# Patient Record
Sex: Female | Born: 1960 | Race: White | Hispanic: No | Smoking: Former smoker
Health system: Southern US, Community
[De-identification: ages and names within clinical notes are randomized; demographics above are authoritative.]

## PROBLEM LIST (undated history)

## (undated) DIAGNOSIS — S42309A Unspecified fracture of shaft of humerus, unspecified arm, initial encounter for closed fracture: Secondary | ICD-10-CM

## (undated) DIAGNOSIS — G629 Polyneuropathy, unspecified: Secondary | ICD-10-CM

## (undated) DIAGNOSIS — S065X9A Traumatic subdural hemorrhage with loss of consciousness of unspecified duration, initial encounter: Secondary | ICD-10-CM

## (undated) DIAGNOSIS — R519 Headache, unspecified: Secondary | ICD-10-CM

## (undated) DIAGNOSIS — F329 Major depressive disorder, single episode, unspecified: Secondary | ICD-10-CM

## (undated) DIAGNOSIS — B1920 Unspecified viral hepatitis C without hepatic coma: Secondary | ICD-10-CM

## (undated) DIAGNOSIS — S065XAA Traumatic subdural hemorrhage with loss of consciousness status unknown, initial encounter: Secondary | ICD-10-CM

## (undated) DIAGNOSIS — E785 Hyperlipidemia, unspecified: Secondary | ICD-10-CM

## (undated) DIAGNOSIS — R569 Unspecified convulsions: Secondary | ICD-10-CM

## (undated) DIAGNOSIS — I2510 Atherosclerotic heart disease of native coronary artery without angina pectoris: Secondary | ICD-10-CM

## (undated) DIAGNOSIS — F1910 Other psychoactive substance abuse, uncomplicated: Secondary | ICD-10-CM

## (undated) DIAGNOSIS — K589 Irritable bowel syndrome without diarrhea: Secondary | ICD-10-CM

## (undated) DIAGNOSIS — F319 Bipolar disorder, unspecified: Secondary | ICD-10-CM

## (undated) DIAGNOSIS — J302 Other seasonal allergic rhinitis: Secondary | ICD-10-CM

## (undated) DIAGNOSIS — F32A Depression, unspecified: Secondary | ICD-10-CM

## (undated) DIAGNOSIS — I10 Essential (primary) hypertension: Secondary | ICD-10-CM

## (undated) DIAGNOSIS — J449 Chronic obstructive pulmonary disease, unspecified: Secondary | ICD-10-CM

## (undated) DIAGNOSIS — F419 Anxiety disorder, unspecified: Secondary | ICD-10-CM

## (undated) DIAGNOSIS — G43909 Migraine, unspecified, not intractable, without status migrainosus: Secondary | ICD-10-CM

## (undated) HISTORY — DX: Unspecified viral hepatitis C without hepatic coma: B19.20

## (undated) HISTORY — DX: Unspecified convulsions: R56.9

## (undated) HISTORY — DX: Essential (primary) hypertension: I10

## (undated) HISTORY — PX: OTHER SURGICAL HISTORY: SHX169

## (undated) HISTORY — DX: Other seasonal allergic rhinitis: J30.2

## (undated) HISTORY — PX: APPENDECTOMY: SHX54

## (undated) HISTORY — DX: Polyneuropathy, unspecified: G62.9

## (undated) HISTORY — DX: Major depressive disorder, single episode, unspecified: F32.9

## (undated) HISTORY — DX: Chronic obstructive pulmonary disease, unspecified: J44.9

## (undated) HISTORY — DX: Migraine, unspecified, not intractable, without status migrainosus: G43.909

## (undated) HISTORY — DX: Headache, unspecified: R51.9

## (undated) HISTORY — PX: ABDOMINAL HYSTERECTOMY: SHX81

## (undated) HISTORY — DX: Traumatic subdural hemorrhage with loss of consciousness status unknown, initial encounter: S06.5XAA

## (undated) HISTORY — DX: Anxiety disorder, unspecified: F41.9

## (undated) HISTORY — DX: Bipolar disorder, unspecified: F31.9

## (undated) HISTORY — DX: Other psychoactive substance abuse, uncomplicated: F19.10

## (undated) HISTORY — DX: Atherosclerotic heart disease of native coronary artery without angina pectoris: I25.10

## (undated) HISTORY — DX: Depression, unspecified: F32.A

## (undated) HISTORY — DX: Hyperlipidemia, unspecified: E78.5

## (undated) HISTORY — DX: Unspecified fracture of shaft of humerus, unspecified arm, initial encounter for closed fracture: S42.309A

## (undated) HISTORY — PX: MULTIPLE TOOTH EXTRACTIONS: SHX2053

## (undated) HISTORY — DX: Traumatic subdural hemorrhage with loss of consciousness of unspecified duration, initial encounter: S06.5X9A

## (undated) HISTORY — DX: Irritable bowel syndrome, unspecified: K58.9

---

## 1998-02-24 ENCOUNTER — Encounter: Admission: RE | Admit: 1998-02-24 | Discharge: 1998-02-24 | Payer: Self-pay | Admitting: Hematology and Oncology

## 1999-02-28 ENCOUNTER — Encounter: Admission: RE | Admit: 1999-02-28 | Discharge: 1999-02-28 | Payer: Self-pay | Admitting: Internal Medicine

## 2000-02-23 ENCOUNTER — Encounter: Admission: RE | Admit: 2000-02-23 | Discharge: 2000-02-23 | Payer: Self-pay | Admitting: Hematology and Oncology

## 2000-09-27 ENCOUNTER — Encounter: Admission: RE | Admit: 2000-09-27 | Discharge: 2000-09-27 | Payer: Self-pay

## 2000-10-09 ENCOUNTER — Ambulatory Visit (HOSPITAL_COMMUNITY): Admission: RE | Admit: 2000-10-09 | Discharge: 2000-10-09 | Payer: Self-pay

## 2000-10-11 ENCOUNTER — Encounter: Payer: Self-pay | Admitting: Obstetrics

## 2000-10-11 ENCOUNTER — Encounter: Admission: RE | Admit: 2000-10-11 | Discharge: 2000-10-11 | Payer: Self-pay | Admitting: Obstetrics

## 2001-01-04 ENCOUNTER — Encounter: Admission: RE | Admit: 2001-01-04 | Discharge: 2001-01-04 | Payer: Self-pay | Admitting: Internal Medicine

## 2001-02-26 ENCOUNTER — Encounter: Admission: RE | Admit: 2001-02-26 | Discharge: 2001-02-26 | Payer: Self-pay | Admitting: Internal Medicine

## 2001-03-29 ENCOUNTER — Encounter: Admission: RE | Admit: 2001-03-29 | Discharge: 2001-03-29 | Payer: Self-pay | Admitting: Internal Medicine

## 2001-04-12 ENCOUNTER — Encounter: Admission: RE | Admit: 2001-04-12 | Discharge: 2001-04-12 | Payer: Self-pay | Admitting: Internal Medicine

## 2001-06-17 ENCOUNTER — Encounter: Admission: RE | Admit: 2001-06-17 | Discharge: 2001-06-17 | Payer: Self-pay | Admitting: Internal Medicine

## 2001-09-26 ENCOUNTER — Encounter: Admission: RE | Admit: 2001-09-26 | Discharge: 2001-09-26 | Payer: Self-pay | Admitting: Internal Medicine

## 2001-11-05 ENCOUNTER — Ambulatory Visit (HOSPITAL_COMMUNITY): Admission: RE | Admit: 2001-11-05 | Discharge: 2001-11-05 | Payer: Self-pay | Admitting: Internal Medicine

## 2001-11-05 ENCOUNTER — Encounter: Admission: RE | Admit: 2001-11-05 | Discharge: 2001-11-05 | Payer: Self-pay | Admitting: Internal Medicine

## 2001-11-05 ENCOUNTER — Encounter: Payer: Self-pay | Admitting: Internal Medicine

## 2001-12-21 ENCOUNTER — Emergency Department (HOSPITAL_COMMUNITY): Admission: EM | Admit: 2001-12-21 | Discharge: 2001-12-21 | Payer: Self-pay | Admitting: Emergency Medicine

## 2001-12-27 ENCOUNTER — Encounter: Admission: RE | Admit: 2001-12-27 | Discharge: 2001-12-27 | Payer: Self-pay | Admitting: Internal Medicine

## 2001-12-27 ENCOUNTER — Encounter: Payer: Self-pay | Admitting: Internal Medicine

## 2001-12-27 ENCOUNTER — Ambulatory Visit (HOSPITAL_COMMUNITY): Admission: RE | Admit: 2001-12-27 | Discharge: 2001-12-27 | Payer: Self-pay | Admitting: Internal Medicine

## 2002-07-14 ENCOUNTER — Encounter: Admission: RE | Admit: 2002-07-14 | Discharge: 2002-07-14 | Payer: Self-pay | Admitting: Internal Medicine

## 2002-08-18 ENCOUNTER — Encounter: Admission: RE | Admit: 2002-08-18 | Discharge: 2002-08-18 | Payer: Self-pay | Admitting: Internal Medicine

## 2002-09-24 ENCOUNTER — Ambulatory Visit (HOSPITAL_COMMUNITY): Admission: RE | Admit: 2002-09-24 | Discharge: 2002-09-24 | Payer: Self-pay | Admitting: Internal Medicine

## 2002-09-24 ENCOUNTER — Encounter: Admission: RE | Admit: 2002-09-24 | Discharge: 2002-09-24 | Payer: Self-pay | Admitting: Internal Medicine

## 2002-09-24 ENCOUNTER — Encounter: Payer: Self-pay | Admitting: Internal Medicine

## 2002-12-02 ENCOUNTER — Encounter: Admission: RE | Admit: 2002-12-02 | Discharge: 2002-12-02 | Payer: Self-pay | Admitting: Internal Medicine

## 2002-12-02 ENCOUNTER — Encounter: Payer: Self-pay | Admitting: Internal Medicine

## 2003-07-23 ENCOUNTER — Encounter: Admission: RE | Admit: 2003-07-23 | Discharge: 2003-07-23 | Payer: Self-pay | Admitting: Internal Medicine

## 2003-12-03 ENCOUNTER — Encounter: Admission: RE | Admit: 2003-12-03 | Discharge: 2003-12-03 | Payer: Self-pay | Admitting: Family Medicine

## 2003-12-22 ENCOUNTER — Ambulatory Visit (HOSPITAL_BASED_OUTPATIENT_CLINIC_OR_DEPARTMENT_OTHER): Admission: RE | Admit: 2003-12-22 | Discharge: 2003-12-22 | Payer: Self-pay | Admitting: Orthopedic Surgery

## 2003-12-22 ENCOUNTER — Ambulatory Visit (HOSPITAL_COMMUNITY): Admission: RE | Admit: 2003-12-22 | Discharge: 2003-12-22 | Payer: Self-pay | Admitting: Orthopedic Surgery

## 2003-12-22 ENCOUNTER — Encounter (INDEPENDENT_AMBULATORY_CARE_PROVIDER_SITE_OTHER): Payer: Self-pay | Admitting: Specialist

## 2004-12-27 ENCOUNTER — Encounter: Admission: RE | Admit: 2004-12-27 | Discharge: 2004-12-27 | Payer: Self-pay | Admitting: Family Medicine

## 2005-01-26 ENCOUNTER — Other Ambulatory Visit: Admission: RE | Admit: 2005-01-26 | Discharge: 2005-01-26 | Payer: Self-pay | Admitting: Obstetrics and Gynecology

## 2005-02-23 ENCOUNTER — Ambulatory Visit (HOSPITAL_COMMUNITY): Admission: RE | Admit: 2005-02-23 | Discharge: 2005-02-23 | Payer: Self-pay | Admitting: Gastroenterology

## 2005-02-23 ENCOUNTER — Encounter (INDEPENDENT_AMBULATORY_CARE_PROVIDER_SITE_OTHER): Payer: Self-pay | Admitting: *Deleted

## 2005-09-19 ENCOUNTER — Ambulatory Visit (HOSPITAL_COMMUNITY): Admission: RE | Admit: 2005-09-19 | Discharge: 2005-09-20 | Payer: Self-pay | Admitting: Ophthalmology

## 2006-03-08 ENCOUNTER — Emergency Department (HOSPITAL_COMMUNITY): Admission: EM | Admit: 2006-03-08 | Discharge: 2006-03-08 | Payer: Self-pay | Admitting: Emergency Medicine

## 2007-11-22 ENCOUNTER — Emergency Department (HOSPITAL_COMMUNITY): Admission: EM | Admit: 2007-11-22 | Discharge: 2007-11-22 | Payer: Self-pay | Admitting: Emergency Medicine

## 2008-03-30 ENCOUNTER — Emergency Department (HOSPITAL_COMMUNITY): Admission: EM | Admit: 2008-03-30 | Discharge: 2008-03-30 | Payer: Self-pay | Admitting: Family Medicine

## 2010-03-24 ENCOUNTER — Ambulatory Visit: Payer: Self-pay | Admitting: Gastroenterology

## 2010-09-03 ENCOUNTER — Encounter: Payer: Self-pay | Admitting: Gastroenterology

## 2010-09-04 ENCOUNTER — Encounter: Payer: Self-pay | Admitting: Family Medicine

## 2010-09-04 ENCOUNTER — Encounter: Payer: Self-pay | Admitting: Gastroenterology

## 2010-10-13 ENCOUNTER — Other Ambulatory Visit: Payer: Self-pay | Admitting: Obstetrics and Gynecology

## 2010-10-13 ENCOUNTER — Encounter (INDEPENDENT_AMBULATORY_CARE_PROVIDER_SITE_OTHER): Payer: Medicaid Other | Admitting: Obstetrics and Gynecology

## 2010-10-13 ENCOUNTER — Other Ambulatory Visit (HOSPITAL_COMMUNITY)
Admission: RE | Admit: 2010-10-13 | Discharge: 2010-10-13 | Disposition: A | Discharge status: Home / Self Care | Payer: Medicaid Other | Source: Ambulatory Visit | Attending: Obstetrics and Gynecology | Admitting: Obstetrics and Gynecology

## 2010-10-13 DIAGNOSIS — R87612 Low grade squamous intraepithelial lesion on cytologic smear of cervix (LGSIL): Secondary | ICD-10-CM

## 2010-10-13 DIAGNOSIS — R87619 Unspecified abnormal cytological findings in specimens from cervix uteri: Secondary | ICD-10-CM | POA: Insufficient documentation

## 2010-11-16 ENCOUNTER — Ambulatory Visit (INDEPENDENT_AMBULATORY_CARE_PROVIDER_SITE_OTHER): Payer: Medicaid Other | Admitting: Obstetrics and Gynecology

## 2010-11-16 DIAGNOSIS — R87612 Low grade squamous intraepithelial lesion on cytologic smear of cervix (LGSIL): Secondary | ICD-10-CM

## 2010-12-30 NOTE — Op Note (Signed)
NAME:  Vanessa Haas, Vanessa Haas             ACCOUNT NO.:  [id number]   MEDICAL RECORD NO.:  [id number]          PATIENT TYPE:  AMB   LOCATION:  SDS                          FACILITY:  [facility]   PHYSICIAN:  John D. Ashley Royalty, M.D. DATE OF BIRTH:  14-Sep-1960   DATE OF PROCEDURE:  09/19/2005  DATE OF DISCHARGE:                                 OPERATIVE REPORT   PREOPERATIVE DIAGNOSIS:  Aphakia, vitreous hemorrhage, retained lens  material.   PROCEDURES:  Pars plana vitrectomy, removal of lens material, placement of  secondary intraocular lens with suture, retinal photocoagulation.   SURGEON:  Alan Mulder, M.D.   ASSISTANT:  Rosalie Doctor, MA   ANESTHESIA:  Is general.   DETAILS:  Usual prep and drape, peritomy from 8 o'clock to 4 o'clock. Half-  thickness scleral flaps raised at 3 o'clock and 9 o'clock in anticipation of  IOL suture. A three layer corneal wound was prepared. 25 gauge trocars were  placed at 8, 10 and 2 o'clock. Pars plana vitrectomy was begun with the 25-  gauge system. Lens material was seen in the pupillary axis and removed. The  vitrectomy is carried posteriorly where all vitreous was removed. The  vitreous hemorrhage was seen at 4 o'clock along with additional lens  fragments. Once all vitreous was removed. The instruments removed from the  eye and plugs were placed. The corneal wound was opened. Two 10-0 Prolene  sutures were passed from 3 o'clock to 9 o'clock behind the iris in the  ciliary sulcus. The sutures were externalized through the corneal wound with  vitreous forceps. The intraocular lens was moved into place where it was  brought onto the field and inspected. It was cleaned. The intraocular lens  was made by [employer] model CZ70BD power 21.5D, length  12.5 MM, optic 7.0 MM, serial number [id number], expiration date 09/2009.  The sutures were attached to the eyelids of the lens. The lens was placed  through the corneal scleral wound and  into the posterior chamber. The lens  was dialed into place and the sutures drawn securely externally. External  sutures were Prolene sutures were then tied beneath the scleral flaps. 10-0  nylon was placed at 3 o'clock scleral flap for closure. The corneal wound  was closed with seven interrupted 10-0 nylon sutures. Trocars were removed  and the wounds were found to be tight. The conjunctiva was reposited with 7-  0 chromic suture. The indirect ophthalmoscope laser was moved into place,  648 burns placed around the retinal periphery with a power of 300  milliwatts, 1000 microns each and 0.07 seconds each. Polymyxin and  gentamicin were irrigated into tenon's space, Decadron 10 milligrams was  injected to the lower subconjunctival space. Marcaine was injected around  the globe for postop pain. Closing tension was 10 with Barraquer tonometer.  Complications none. Duration 1-1/2 hours. TobraDex ophthalmic ointment, a  patch and shield were placed. The patient was awakened and taken to recovery  room in satisfactory condition.     [name]. Ashley Royalty, M.D.  Electronically Signed    JDM/MEDQ  D:  09/19/2005  T:  09/20/2005  Job:  [id number]

## 2010-12-30 NOTE — Op Note (Signed)
NAME:  Vanessa Haas, Vanessa Haas                    ACCOUNT NO.:  [id number]   MEDICAL RECORD NO.:  [id number]                   PATIENT TYPE:  AMB   LOCATION:  DSC                                  FACILITY:  [facility]   PHYSICIAN:  Katy Fitch. Naaman Plummer., M.D.          DATE OF BIRTH:  [birth year]   DATE OF PROCEDURE:  12/22/2003  DATE OF DISCHARGE:                                 OPERATIVE REPORT   PREOPERATIVE DIAGNOSIS:  Soft tissue mass, dorsal radial aspect of right  small finger P2 with background history of arthralgias and elevated ANA with  positive speckled pattern on laboratory testing, rule out an inflammatory  arthropathy.   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   OPERATION PERFORMED:  Excisional biopsy of mass, dorsal radial aspect right  small finger P1 segment.   SURGEON:  Katy Fitch. Sypher, M.D.   ASSISTANT:  Jonni Sanger, P.A.   ANESTHESIA:  2% lidocaine metacarpal head level block of right small finger  supplemented by IV sedation.   SUPERVISING ANESTHESIOLOGIST:  Judie Petit, M.D.   INDICATIONS FOR PROCEDURE:  Vanessa Haas was referred by Vanessa Haas, M.D. of [facility] on October 06, 2003 for  evaluation of multiple bumps on her right hand that she felt was causing  pressure on her digital nerves.  She had a poorly characterized pain  syndrome consistent with arthralgia and discomfort in her fingers.  Clinical  examination on October 09, 2003 revealed several vague soft tissue  swellings on the dorsal radial aspect of her right small finger P1 segment,  the PIP and P2 segment of her right ring finger and the dorsal ulnar aspect  of her right long finger.  Her clinical examination did not reveal an  obvious diagnosis; however, her findings were consistent with some type of  periarticular inflammatory predicament.  I recommended a panel of lab  studies be obtained to rule out a possible  inflammatory arthritis disorder  such as rheumatoid arthritis or lupus or perhaps another autoimmune  connective tissue disease.  Multiple lab studies were obtained on October 07, 2003 including an ANA that was positive with a 1:160 dilution, speckled  pattern, rheumatoid factor which was within normal limits at 5.6, a C-  reactive protein that was elevated at 19.3 with upper limit of normal 4.9  and a sedimentation rate of 7.  Given the findings of an elevated C-reactive  protein and an elevated ANA, I referred Vanessa Haas to Vanessa Haas,  M.D. at [facility].  Vanessa Haas was unable to identify  a discrete arthritis disorder.  Vanessa Haas does have a history of past  chronic hepatitis B.  Due to the presence of her bumps and an unclear  diagnosis as well as persistent discomfort, Vanessa Haas requested biopsy.  We initially contemplated biopsy of the nodules of her long, ring and small  finger; however, with longitudinal observation of  her predicament, we noted  some regression of the nodules in her long finger and her ring finger  although there is still a nodule palpable in the subcutaneous region of her  ring finger P2 segment, dorsoradially.  She had her most defined nodule on  the dorsal radial aspect of her right small finger.  Therefore during  informed consent prior to surgery, I recommended confining our diagnostic  efforts to a biopsy of the small finger nodule in an effort to establish a  tissue diagnosis. In the preoperative holding area, Vanessa Haas was noted  to have a mass measuring approximately 1.5 cm in length and approximately 7  to 8 mm in width that had the feel of a possible granuloma annulare or  perhaps an atypical lipoma.  This did not appear to be a cystic mass such as  a ganglion.  After informed consent she was brought to the operating room at  this time anticipating local anesthesia and sedation.   DESCRIPTION OF PROCEDURE:   Vanessa Haas was brought to the operating  room and placed in supine position on the operating table.  Following  anesthesia consultation by Judie Petit, M.D. and myself, we decided  upon digital block and sedation as the proper anesthetic choice.  Vanessa Haas  does have a history of a seizure disorder and has been advised to avoid  general anesthesia.  After routine Betadine prep, 2% lidocaine was  infiltrated at the metacarpal head level remote from the biopsy site.  Light  sedation was administered.  The right arm was prepped with Betadine soap and  solution and sterilely draped.  A pneumatic tourniquet was applied to the  proximal forearm.  Following exsanguination of the arm with an Esmarch  bandage, an arterial tourniquet on the proximal forearm was inflated to  220  mmHg.  The procedure commenced with a longitudinal incision directly over  the palpable mass.  Subcutaneous tissues were carefully divided revealing a  poorly defined fatty, bland-appearing mass.  This had the consistency of a  possible granuloma annulare or an atypical lipoma.  Care was taken to  dissect the dorsal radial sensory branch to the small finger out of the mass  prior to resection so that she would not have a significant sensory deficit  around the PIP joint.  The mass was circumferentially dissected and passed  off in Formalin for pathologic evaluation.  I also carefully inspected the  palmar aspect of the finger including the radial neurovascular bundle and  did not find any extensions of the mass.   At the conclusion of the procedure, I could not identify what the pathologic  process was based on gross examination.  Hopefully the biopsy specimen will  be of utility in assigning a tissue diagnosis that would be compatible with  her current subjective complaints and objective findings of waxing and waning swelling with abnormal lab studies.  There were no apparent  complications. The wound was  irrigated, hemostasis achieved with bipolar  cautery followed by repair of the skin with intradermal 3-0 Prolene.   For aftercare, she was transferred to the recovery room with stable vital  signs.  She will be discharged with a prescription for Darvocet-N 100 one or  two tablets by mouth every four to six hours as needed for pain, 20 tablets  without refill.  She will return to our office for follow-up in one week for  dressing change, suture removal and advancement to an exercise program.  Katy Fitch Naaman Plummer., M.D.    RVS/MEDQ  D:  12/22/2003  T:  12/23/2003  Job:  [id number]   cc:   Vanessa Haas, M.D.  [address]  [place]  [place] [address]  Fax: [phone]   Vanessa Haas, M.D.  [address]  [place]  [place] [address]  Fax: [phone]

## 2011-07-12 ENCOUNTER — Encounter: Payer: Self-pay | Admitting: Advanced Practice Midwife

## 2011-07-12 ENCOUNTER — Ambulatory Visit: Payer: Medicaid Other | Admitting: Obstetrics and Gynecology

## 2011-07-12 ENCOUNTER — Ambulatory Visit (INDEPENDENT_AMBULATORY_CARE_PROVIDER_SITE_OTHER): Payer: Medicaid Other | Admitting: Advanced Practice Midwife

## 2011-07-12 ENCOUNTER — Other Ambulatory Visit (HOSPITAL_COMMUNITY)
Admission: RE | Admit: 2011-07-12 | Discharge: 2011-07-12 | Disposition: A | Discharge status: Home / Self Care | Payer: Medicaid Other | Source: Ambulatory Visit | Attending: Obstetrics and Gynecology | Admitting: Obstetrics and Gynecology

## 2011-07-12 VITALS — BP 137/89 | HR 96 | Temp 97.2°F | Resp 20 | Ht 63.0 in | Wt 145.5 lb

## 2011-07-12 DIAGNOSIS — Z124 Encounter for screening for malignant neoplasm of cervix: Secondary | ICD-10-CM

## 2011-07-12 DIAGNOSIS — Z01419 Encounter for gynecological examination (general) (routine) without abnormal findings: Secondary | ICD-10-CM | POA: Insufficient documentation

## 2011-07-12 NOTE — Patient Instructions (Signed)
Abnormal Pap Test WHAT DOES A PAP TEST CHECK FOR? A Pap test checks the cells in the cervix for any infections or changes that may turn into cancer. The cells are checked to see if they look normal or if they show any changes (abnormal). Abnormal changes may be a sign of problems with your cervix. Cervical cells that are abnormal are called cervical dysplasia. Dysplasia is not cancer. It is a pre-cancerous change in the cells of your cervix. WHAT DOES AN ABNORMAL PAP TEST MEAN? Most times, an abnormal Pap test does not mean you have cancer. However, it does show that there may be a problem. Your doctor will want to do other tests to find out more about the abnormal cells.  Your abnormal Pap test results could show:  Small changes that should be carefully watched.   Dysplasia that could grow into cancer.   Cancer.  When dysplasia is found and treated early, it most often does not grow into cancer. WHAT WILL BE DONE ABOUT MY ABNORMAL PAP TEST? You may have:  A colposcopy test done. Your cevix will be looked at using a strong light and a microscope.   A cone biopsy. A small, cone-shaped sample of your cervix is taken out. The part that is taken out is the area where the abnormal cells are.   Cryosurgery. The abnormal cells on your cervix will be frozen.   Loop Electrical Excision Procedure (LEEP). The abnormal cells will be taken out.  WHAT IF I HAVE A DYSPLASIA OR A CANCER? You and your doctor may choose a hysterectomy as the best treatment for you. During this surgery, your womb (uterus) and your cervix will be taken out. WHAT SHOULD YOU DO AFTER BEING TREATED? Keep having Pap tests and checkups as often as your doctor tells you. Your cervical problem will be carefully watched so it does not get worse. Also, your doctor can watch for, and treat, any new problems that may come up. Document Released: 11/15/2010 Document Revised: 04/13/2011 Document Reviewed: 11/15/2010 [employer] Patient  Information 2012 [employer]. 

## 2011-07-12 NOTE — Progress Notes (Signed)
Addended by: Toula Moos on: 07/12/2011 04:17 PM   Modules accepted: Orders

## 2011-07-12 NOTE — Progress Notes (Signed)
°  Subjective:     Vanessa Haas is a 50 y.o. woman who comes in today for a  pap smear only. 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 Previous abnormal Pap smears: yes - CIN1. Patient reports no new spotting or abnormal discharge.  The following portions of the patient's history were reviewed and updated as appropriate: allergies, current medications, past family history, past medical history, past social history, past surgical history and problem list.  Review of Systems Pertinent items are noted in HPI.   Objective:    BP 137/89   Pulse 96   Temp(Src) 97.2 F (36.2 C) (Oral)   Resp 20   Ht 5\' 3"  (1.6 m)   Wt 145 lb 8 oz (65.998 kg)   BMI 25.77 kg/m2 Pelvic Exam: cervix normal in appearance, external genitalia normal and vagina normal without discharge. Pap smear obtained.   Assessment:    Screening pap smear.   Plan:   Discussed with patient plan of care. Will await results of screening pap smear. Should results come back abnormal, patient will need repeat colposcopy. With negative results, patient will require annual pap smear screenings for at minimum 10 years. Patient information was distributed. Patient was agreeable to this plan.  Follow up in 6 months, or as indicated by Pap results.   Mosetta Putt, PA-S 07/12/2011

## 2014-06-15 ENCOUNTER — Encounter: Payer: Self-pay | Admitting: Advanced Practice Midwife

## 2018-02-01 ENCOUNTER — Encounter (HOSPITAL_COMMUNITY): Payer: Self-pay | Admitting: *Deleted

## 2018-02-01 ENCOUNTER — Emergency Department (HOSPITAL_COMMUNITY)
Admission: EM | Admit: 2018-02-01 | Discharge: 2018-02-01 | Disposition: A | Discharge status: Home / Self Care | Payer: Medicaid Other | Attending: Emergency Medicine | Admitting: Emergency Medicine

## 2018-02-01 DIAGNOSIS — B1710 Acute hepatitis C without hepatic coma: Secondary | ICD-10-CM | POA: Diagnosis not present

## 2018-02-01 DIAGNOSIS — Z87891 Personal history of nicotine dependence: Secondary | ICD-10-CM | POA: Diagnosis not present

## 2018-02-01 DIAGNOSIS — R197 Diarrhea, unspecified: Secondary | ICD-10-CM | POA: Insufficient documentation

## 2018-02-01 DIAGNOSIS — R1013 Epigastric pain: Secondary | ICD-10-CM | POA: Diagnosis present

## 2018-02-01 DIAGNOSIS — Z79899 Other long term (current) drug therapy: Secondary | ICD-10-CM | POA: Insufficient documentation

## 2018-02-01 DIAGNOSIS — J449 Chronic obstructive pulmonary disease, unspecified: Secondary | ICD-10-CM | POA: Diagnosis not present

## 2018-02-01 DIAGNOSIS — F319 Bipolar disorder, unspecified: Secondary | ICD-10-CM | POA: Insufficient documentation

## 2018-02-01 MED ORDER — GI COCKTAIL ~~LOC~~
30.0000 mL | Freq: Once | ORAL | Status: AC
  Administered 2018-02-01: 30 mL via ORAL
  Filled 2018-02-01: qty 30

## 2018-02-01 MED ORDER — HYDROMORPHONE HCL 1 MG/ML IJ SOLN
0.5000 mg | Freq: Once | INTRAMUSCULAR | Status: AC
  Administered 2018-02-01: 0.5 mg via INTRAMUSCULAR
  Filled 2018-02-01: qty 1

## 2018-02-01 MED ORDER — OXYCODONE-ACETAMINOPHEN 5-325 MG PO TABS: 1.0000 | ORAL_TABLET | Freq: Four times a day (QID) | ORAL | 0 refills | Status: DC | PRN

## 2018-02-01 MED ORDER — PANTOPRAZOLE SODIUM 40 MG PO TBEC
40.0000 mg | DELAYED_RELEASE_TABLET | Freq: Once | ORAL | Status: AC
  Administered 2018-02-01: 40 mg via ORAL
  Filled 2018-02-01: qty 1

## 2018-02-01 MED ORDER — PANTOPRAZOLE SODIUM 20 MG PO TBEC: 20.0000 mg | DELAYED_RELEASE_TABLET | Freq: Two times a day (BID) | ORAL | 0 refills | Status: DC

## 2018-02-01 NOTE — ED Triage Notes (Signed)
Pt complains of right sided abdominal pain, nausea, vomiting, diarrhea. Pt was seen at [facility] and prescribed bentyl, which she states has not helped. Pt has hx of appendectomy and IBS.

## 2018-02-01 NOTE — ED Provider Notes (Signed)
[facility]-EMERGENCY DEPT Provider Note   CSN: [id number] Arrival date & time: 02/01/18  0908     History   Chief Complaint Chief Complaint  Patient presents with  . Abdominal Pain    HPI Vanessa Haas is a 57 y.o. female.  HPI    57 year old female with abdominal pain.  Upper abdomen.  Epigastric to right upper quadrant.  Onset almost a week ago at this point.  She describes burning pain which waxes and wanes but does not completely go away.  Associated with diarrhea.  No fevers or chills.  She reports that she was seen at [facility] 2 days ago.  She had labs and a CT the abdomen pelvis which were unrevealing.  She says she was prescribed Bentyl which she is tried taking without much improvement.  Surgical history significant for appendectomy and cholecystectomy.  She reports that she also sent stool studies there is a GI doctor which came back negative.  She denies any sick contacts.  She has a past history of IBS.  Reports upcoming appointment with her GI doctor on either with this upcoming Tuesday or Wednesday.  [facility] same symptoms. Had CT a/p at this time with report as below. LFTs were WNL at that time.  Results for orders placed or performed during the hospital encounter of 01/30/18  CT ABDOMEN PELVIS W CONTRAST (ROUTINE)  Narrative  CLINICAL DATA: Right upper quadrant abdominal pain. Prior cholecystectomy.  EXAM: CT ABDOMEN AND PELVIS WITH CONTRAST  TECHNIQUE: Multidetector CT imaging of the abdomen and pelvis was performed using the standard protocol following bolus administration of intravenous contrast.  CONTRAST: 100 cc Omnipaque 350 intravenous contrast.  COMPARISON: CT abdomen pelvis dated February 14, 2017.  FINDINGS: Lower chest: No acute abnormality.  Hepatobiliary: Stable hepatic cysts. Status post cholecystectomy. Mild common bile duct and intrahepatic biliary dilatation without obstructing  lesion.  Pancreas: Unremarkable. No pancreatic ductal dilatation or surrounding inflammatory changes.  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 The spleen is normal in size.  Adrenals/Urinary Tract: Adrenal glands are unremarkable. Kidneys are normal, without renal calculi, focal lesion, or hydronephrosis. Bladder is unremarkable.  Stomach/Bowel: Stomach is within normal limits. Appendix is surgically absent. No evidence of bowel wall thickening, distention, or inflammatory changes.  Vascular/Lymphatic: Aortic atherosclerosis. No enlarged abdominal or pelvic lymph nodes.  Reproductive: Status post hysterectomy. No adnexal masses.  Other: No abdominal wall hernia or abnormality. No abdominopelvic ascites. No pneumoperitoneum.  Musculoskeletal: No acute or significant osseous findings. Unchanged moderate degenerative disc disease at L4-L5 with 3 mm retrolisthesis. Stable Schmorl's nodes involving the superior endplates of T12 and L1. Unchanged chronic mild L2 superior endplate compression deformity.  IMPRESSION: 1. Mild common bile duct and intrahepatic biliary dilatation without obstructing lesion, favored related to post cholecystectomy state. However, given the patient's clinical symptoms, correlation with LFTs is recommended. If abnormal, consider further evaluation with ERCP or MRCP. 2. Interval decrease in size of chronic splenic subcapsular hematoma, now measuring 7 mm, previously 1.8 cm. 3. Aortic atherosclerosis (ICD10-I70.0).  Electronically Signed By: Obie DredgeWilliam T Derry M.D. On: 01/30/2018 09:49    Past Medical History:  Diagnosis Date  . Anxiety   . Bipolar 1 disorder, depressed (HCC)   . Cataract   . COPD (chronic obstructive pulmonary disease) (HCC)   . Hepatitis C    Type II  . Seizures (HCC)   .  Substance abuse (HCC)    drug free x4 mos (cocaine)     There are no active problems to display for this patient.   Past Surgical History:  Procedure Laterality Date  . APPENDECTOMY    . MULTIPLE TOOTH EXTRACTIONS     full upper and lower dentures     OB History    Gravida  2   Para      Term      Preterm      AB  2   Living        SAB      TAB  2   Ectopic      Multiple      Live Births               Home Medications    Prior to Admission medications   Medication Sig Start Date End Date Taking? Authorizing Provider  budesonide (ENTOCORT EC) 3 MG 24 hr capsule Take 6 mg by mouth every morning.      [provider]  buPROPion (WELLBUTRIN XL) 150 MG 24 hr tablet Take 150 mg by mouth daily.      [provider]  Diclofenac Potassium (CAMBIA PO) Take by mouth. As needed for migraine H/A     [provider]  Diclofenac Potassium (CAMBIA) 50 MG PACK Take by mouth.      [provider]  dicyclomine (BENTYL) 10 MG capsule Take 10 mg by mouth 4 (four) times daily.      [provider]  hydrOXYzine (ATARAX/VISTARIL) 50 MG tablet Take 50 mg by mouth 2 (two) times daily as needed.      [provider]  paroxetine mesylate (PEXEVA) 40 MG tablet Take 40 mg by mouth every morning. 2 tabs po daily      [provider]  promethazine (PHENERGAN) 25 MG tablet Take 25 mg by mouth every 6 (six) hours as needed.      [provider]  tiZANidine (ZANAFLEX) 4 MG capsule Take 4 mg by mouth 2 (two) times daily.      [provider]  topiramate (TOPAMAX) 100 MG tablet Take 200 mg by mouth daily.      [provider]  traZODone (DESYREL) 100 MG tablet Take 200 mg by mouth at bedtime.      [provider]    Family History No family history on file.  Social History Social History   Tobacco Use  . Smoking status: Former Smoker    Types: Cigarettes  . Smokeless tobacco: Never Used  Substance Use Topics  . Alcohol use: No  . Drug  use: Yes    Types: Cocaine    Comment: recent past- 4 mos ago stopped     Allergies   Codeine   Review of Systems Review of Systems  All systems reviewed and negative, other than as noted in HPI.  Physical Exam Updated Vital Signs BP 103/82 (BP Location: Right Arm)   Pulse 99   Temp 98.7 F (37.1 C) (Oral)   Resp 18   SpO2 100%   Physical Exam  Constitutional: She appears well-developed and well-nourished. No distress.  HENT:  Head: Normocephalic and atraumatic.  Eyes: Conjunctivae are normal. Right eye exhibits no discharge. Left eye exhibits no discharge.  Neck: Neck supple.  Cardiovascular: Normal rate, regular rhythm and normal heart sounds. Exam reveals no gallop and no friction rub.  No murmur heard. Pulmonary/Chest: Effort normal and breath sounds normal. No  respiratory distress.  Abdominal: Soft. She exhibits no distension. There is tenderness.  Epigastric and right upper quadrant tenderness without rebound or guarding.  No distention.  Musculoskeletal: She exhibits no edema or tenderness.  Neurological: She is alert.  Skin: Skin is warm and dry.  Psychiatric: She has a normal mood and affect. Her behavior is normal. Thought content normal.  Nursing note and vitals reviewed.    ED Treatments / Results  Labs (all labs ordered are listed, but only abnormal results are displayed) Labs Reviewed - No data to display  EKG None  Radiology No results found.  Procedures Procedures (including critical care time)  Medications Ordered in ED Medications - No data to display   Initial Impression / Assessment and Plan / ED Course  I have reviewed the triage vital signs and the nursing notes.  Pertinent labs & imaging results that were available during my care of the patient were reviewed by me and considered in my medical decision making (see chart for details).     56yF with abdominal pain. Persistent for several days. Seen at [facility] less than 48 hours ago.  Had labs and CT a/p which were fairly unremarkable. She is s/p cholecystectomy and appendectomy.  I do not think repeating labs nor further imaging are needed on an emergent basis w/o significant change in symptoms since she was last seen.   I reviewed her medications. She is not on a PPI or H2 blocker. Meloxicam noted on med list. When questioned about this she reports she thought it was an allergy medication and has actually been taking it twice a day for over a month. This could simply be NSAID induced gastritis or an ulcer. Advised her to stop taking meloxicam and other NSAIDs. Will start on PPI. She has scheduled GI appointment this upcoming week. Return precautions were discussed.  Final Clinical Impressions(s) / ED Diagnoses   Final diagnoses:  Epigastric pain    ED Discharge Orders    None       Raeford Razor, MD 02/01/18 [phone]

## 2018-02-01 NOTE — ED Notes (Signed)
Bed: [facility] Expected date:  Expected time:  Means of arrival:  Comments: 

## 2018-02-01 NOTE — Discharge Instructions (Addendum)
Stop taking meloxicam (mobic). These are NSAID pain medications like ibuprofen, naproxen, etc. These can be very irritating to your stomach if taken in high doses or for long periods of time. I suspect this may be the reason for your pain. I do not think repeating labs or obtaining more imaging is necessary at this time.   You are being prescribed a medicine for your stomach called protonix. This medicine typically needs to be taken for at least af ew days before people feel much different so don't be discouraged if the pain doesn't go away immediately. Take the prescribed medicine for pain as needed. Follow-up with your GI doctor as previously scheduled and return to the ER for worsening symptoms.

## 2018-02-14 ENCOUNTER — Other Ambulatory Visit: Payer: Self-pay

## 2018-02-14 ENCOUNTER — Emergency Department (HOSPITAL_COMMUNITY)
Admission: EM | Admit: 2018-02-14 | Discharge: 2018-02-14 | Disposition: A | Discharge status: Home / Self Care | Payer: Medicaid Other | Attending: Emergency Medicine | Admitting: Emergency Medicine

## 2018-02-14 ENCOUNTER — Emergency Department (HOSPITAL_COMMUNITY): Payer: Medicaid Other

## 2018-02-14 ENCOUNTER — Encounter (HOSPITAL_COMMUNITY): Payer: Self-pay | Admitting: Emergency Medicine

## 2018-02-14 DIAGNOSIS — J449 Chronic obstructive pulmonary disease, unspecified: Secondary | ICD-10-CM | POA: Insufficient documentation

## 2018-02-14 DIAGNOSIS — R1013 Epigastric pain: Secondary | ICD-10-CM

## 2018-02-14 DIAGNOSIS — R197 Diarrhea, unspecified: Secondary | ICD-10-CM | POA: Insufficient documentation

## 2018-02-14 DIAGNOSIS — Z87891 Personal history of nicotine dependence: Secondary | ICD-10-CM | POA: Insufficient documentation

## 2018-02-14 DIAGNOSIS — R112 Nausea with vomiting, unspecified: Secondary | ICD-10-CM | POA: Diagnosis not present

## 2018-02-14 DIAGNOSIS — Z79899 Other long term (current) drug therapy: Secondary | ICD-10-CM | POA: Diagnosis not present

## 2018-02-14 LAB — CBC WITH DIFFERENTIAL/PLATELET
BASOS ABS: 0.2 10*3/uL — AB (ref 0.0–0.1)
Basophils Relative: 2 %
EOS ABS: 0.5 10*3/uL (ref 0.0–0.7)
EOS PCT: 5 %
HCT: 42.6 % (ref 36.0–46.0)
Hemoglobin: 14.6 g/dL (ref 12.0–15.0)
Lymphocytes Relative: 22 %
Lymphs Abs: 2.1 10*3/uL (ref 0.7–4.0)
MCH: 31.9 pg (ref 26.0–34.0)
MCHC: 34.3 g/dL (ref 30.0–36.0)
MCV: 93 fL (ref 78.0–100.0)
Monocytes Absolute: 0.6 10*3/uL (ref 0.1–1.0)
Monocytes Relative: 6 %
Neutro Abs: 6.2 10*3/uL (ref 1.7–7.7)
Neutrophils Relative %: 65 %
PLATELETS: 204 10*3/uL (ref 150–400)
RBC: 4.58 MIL/uL (ref 3.87–5.11)
RDW: 12.6 % (ref 11.5–15.5)
WBC: 9.6 10*3/uL (ref 4.0–10.5)

## 2018-02-14 LAB — COMPREHENSIVE METABOLIC PANEL
ALT: 23 U/L (ref 0–44)
AST: 67 U/L — AB (ref 15–41)
Albumin: 3.7 g/dL (ref 3.5–5.0)
Alkaline Phosphatase: 67 U/L (ref 38–126)
Anion gap: 9 (ref 5–15)
BUN: 9 mg/dL (ref 6–20)
CHLORIDE: 108 mmol/L (ref 98–111)
CO2: 22 mmol/L (ref 22–32)
CREATININE: 0.98 mg/dL (ref 0.44–1.00)
Calcium: 8.8 mg/dL — ABNORMAL LOW (ref 8.9–10.3)
GFR calc non Af Amer: >60 mL/min (ref >60)
Glucose, Bld: 91 mg/dL (ref 70–99)
Potassium: 4.2 mmol/L (ref 3.5–5.1)
SODIUM: 139 mmol/L (ref 135–145)
Total Bilirubin: 0.6 mg/dL (ref 0.3–1.2)
Total Protein: 6.8 g/dL (ref 6.5–8.1)

## 2018-02-14 LAB — URINALYSIS, ROUTINE W REFLEX MICROSCOPIC
Bilirubin Urine: NEGATIVE
GLUCOSE, UA: NEGATIVE mg/dL
HGB URINE DIPSTICK: NEGATIVE
Ketones, ur: NEGATIVE mg/dL
Leukocytes, UA: NEGATIVE
Nitrite: NEGATIVE
Protein, ur: NEGATIVE mg/dL
SPECIFIC GRAVITY, URINE: 1.002 — AB (ref 1.005–1.030)
pH: 6 (ref 5.0–8.0)

## 2018-02-14 LAB — RAPID URINE DRUG SCREEN, HOSP PERFORMED
AMPHETAMINES: NOT DETECTED
BENZODIAZEPINES: NOT DETECTED
Cocaine: NOT DETECTED
Opiates: NOT DETECTED
TETRAHYDROCANNABINOL: NOT DETECTED

## 2018-02-14 LAB — ETHANOL: ALCOHOL ETHYL (B): 82 mg/dL — AB (ref <10)

## 2018-02-14 LAB — LIPASE, BLOOD: Lipase: 29 U/L (ref 11–51)

## 2018-02-14 MED ORDER — SUCRALFATE 1 G PO TABS
1.0000 g | ORAL_TABLET | Freq: Once | ORAL | Status: AC
Start: 2018-02-14 — End: 2018-02-14
  Administered 2018-02-14: 1 g via ORAL
  Filled 2018-02-14: qty 1

## 2018-02-14 MED ORDER — GI COCKTAIL ~~LOC~~
30.0000 mL | Freq: Once | ORAL | Status: AC
  Administered 2018-02-14: 30 mL via ORAL
  Filled 2018-02-14: qty 30

## 2018-02-14 NOTE — ED Notes (Signed)
Patient walked to nurses station asking for drink. Patients results are not all back notified patient that we will have to ask the doctor first. Patient walked back to room.

## 2018-02-14 NOTE — ED Provider Notes (Signed)
[facility]-EMERGENCY DEPT Provider Note   CSN: [id number] Arrival date & time: 02/14/18  1522     History   Chief Complaint Chief Complaint  Patient presents with  . Abdominal Pain    HPI Vanessa Haas is a 57 y.o. female.  HPI Vanessa NoonLaurie B Haas is a 57 y.o. female with history of bipolar disorder, COPD, hepatitis C, prior appendectomy and cholecystectomy, presents to emergency department complaining of abdominal pain, nausea, vomiting, diarrhea.  Patient states that she has had ongoing abdominal pain for which she has been seen here, at [facility], and by family doctor and gastroenterologist.  She has been seen at least 4 times in the last week for the same.  She is currently taking Bentyl, Protonix, cholestyramine.  She states today she ate lunch and had 2 beers and states that shortly after developed severe epigastric pain.  She has had episode of emesis and diarrhea since then.  She tried taking Bentyl but it did not help.  She received Zofran and 100 mcg of fentanyl by EMS which did not help.  Patient stated "you might of as well given the sugar packet."  Patient has a colonoscopy and endoscopy scheduled in 1 week.  Past Medical History:  Diagnosis Date  . Anxiety   . Bipolar 1 disorder, depressed (HCC)   . Cataract   . COPD (chronic obstructive pulmonary disease) (HCC)   . Hepatitis C    Type II  . Seizures (HCC)   . Substance abuse (HCC)    drug free x4 mos (cocaine)    There are no active problems to display for this patient.   Past Surgical History:  Procedure Laterality Date  . APPENDECTOMY    . MULTIPLE TOOTH EXTRACTIONS     full upper and lower dentures     OB History    Gravida  2   Para      Term      Preterm      AB  2   Living        SAB      TAB  2   Ectopic      Multiple      Live Births               Home Medications    Prior to Admission medications   Medication Sig Start Date End  Date Taking? Authorizing Provider  albuterol (PROVENTIL HFA;VENTOLIN HFA) 108 (90 Base) MCG/ACT inhaler Inhale 2 puffs into the lungs every 6 (six) hours as needed for wheezing or shortness of breath.    [provider]  budesonide-formoterol (SYMBICORT) 80-4.5 MCG/ACT inhaler Inhale 2 puffs into the lungs 2 (two) times daily.    [provider]  buPROPion (WELLBUTRIN) 75 MG tablet Take 75 mg by mouth daily.    [provider]  clonazePAM (KLONOPIN) 1 MG tablet Take 1 mg by mouth 2 (two) times daily.  01/08/18   [provider]  dicyclomine (BENTYL) 20 MG tablet Take 20 mg by mouth 2 (two) times daily. 01/30/18   [provider]  ipratropium-albuterol (DUONEB) 0.5-2.5 (3) MG/3ML SOLN Take 3 mLs by nebulization 4 (four) times daily as needed (shortness of breath).  01/15/18   [provider]  meloxicam (MOBIC) 7.5 MG tablet Take 7.5 mg by mouth daily.    [provider]  montelukast (SINGULAIR) 10 MG tablet Take 10 mg by mouth daily. 01/30/18   [provider]  oxyCODONE-acetaminophen (PERCOCET/ROXICET) 5-325 MG tablet Take 1 tablet by mouth every 6 (six) hours as needed for severe pain. 02/01/18   Raeford Razor, MD  pantoprazole (PROTONIX) 20 MG tablet Take 1 tablet (20 mg total) by mouth 2 (two) times daily before a meal. 02/01/18   Raeford Razor, MD  prazosin (MINIPRESS) 5 MG capsule Take 5 mg by mouth daily.    [provider]  Promethazine-DM 6.25-15 MG/5ML SOLN Take 5 mLs by mouth every 6 (six) hours as needed (cough).  01/18/18   [provider]  SUMAtriptan (IMITREX) 100 MG tablet Take 100 mg by mouth every 2 (two) hours as needed for migraine. May repeat in 2 hours if headache persists or recurs.    [provider]  tiotropium (SPIRIVA) 18 MCG inhalation capsule Place 18 mcg into inhaler and inhale daily as needed (shortness of breath).    [provider]  topiramate (TOPAMAX) 100 MG tablet  Take 200 mg by mouth at bedtime.     [provider]    Family History History reviewed. No pertinent family history.  Social History Social History   Tobacco Use  . Smoking status: Former Smoker    Types: Cigarettes  . Smokeless tobacco: Never Used  Substance Use Topics  . Alcohol use: No  . Drug use: Yes    Types: Cocaine    Comment: recent past- 4 mos ago stopped     Allergies   Codeine; Levofloxacin; and Tape   Review of Systems Review of Systems  Constitutional: Negative for chills and fever.  Respiratory: Negative for cough, chest tightness and shortness of breath.   Cardiovascular: Negative for chest pain, palpitations and leg swelling.  Gastrointestinal: Positive for abdominal pain, diarrhea, nausea and vomiting.  Genitourinary: Negative for dysuria, flank pain, pelvic pain, vaginal bleeding, vaginal discharge and vaginal pain.  Musculoskeletal: Negative for arthralgias, myalgias, neck pain and neck stiffness.  Skin: Negative for rash.  Neurological: Negative for dizziness, weakness and headaches.  All other systems reviewed and are negative.    Physical Exam Updated Vital Signs BP 93/81   Pulse 99   Temp 98.5 F (36.9 C) (Oral)   Resp 18   Ht 5\' 3"  (1.6 m)   Wt 72.6 kg (160 lb)   SpO2 95%   BMI 28.34 kg/m   Physical Exam  Constitutional: She appears well-developed and well-nourished. No distress.  HENT:  Head: Normocephalic.  Eyes: Conjunctivae are normal.  Neck: Neck supple.  Cardiovascular: Normal rate, regular rhythm and normal heart sounds.  Pulmonary/Chest: Effort normal and breath sounds normal. No respiratory distress. She has no wheezes. She has no rales.  Abdominal: Soft. Bowel sounds are normal. She exhibits no distension. There is no rebound.  Abdomen feels firm, slightly distended.  Normal bowel sounds.  Diffuse tenderness to palpation  Musculoskeletal: She exhibits no edema.  Neurological: She is alert.  Skin: Skin is warm  and dry.  Psychiatric: She has a normal mood and affect. Her behavior is normal.  Nursing note and vitals reviewed.    ED Treatments / Results  Labs (all labs ordered are listed, but only abnormal results are displayed) Labs Reviewed  CBC WITH DIFFERENTIAL/PLATELET - Abnormal; Notable for the following components:      Result Value   Basophils Absolute 0.2 (*)    All other components within normal limits  COMPREHENSIVE METABOLIC PANEL - Abnormal; Notable for the following components:   Calcium 8.8 (*)    AST 67 (*)  All other components within normal limits  ETHANOL - Abnormal; Notable for the following components:   Alcohol, Ethyl (B) 82 (*)    All other components within normal limits  URINALYSIS, ROUTINE W REFLEX MICROSCOPIC - Abnormal; Notable for the following components:   Color, Urine STRAW (*)    Specific Gravity, Urine 1.002 (*)    All other components within normal limits  RAPID URINE DRUG SCREEN, HOSP PERFORMED - Abnormal; Notable for the following components:   Barbiturates   (*)    Value: Result not available. Reagent lot number recalled by manufacturer.   All other components within normal limits  LIPASE, BLOOD    EKG None  Radiology Dg Abd Acute W/chest  Result Date: 02/14/2018 CLINICAL DATA:  57 year old female with acute abdominal pain and nausea. EXAM: DG ABDOMEN ACUTE W/ 1V CHEST COMPARISON:  11/19/2017 chest radiograph and prior studies FINDINGS: Nondistended gas and fluid-filled loops of bowel are noted. Small amount of gas in the colon and rectum noted. No evidence of pneumoperitoneum or suspicious calcifications. Cardiomediastinal silhouette is unremarkable. The lungs are clear. No pleural effusion or pneumothorax. No acute bony abnormalities. IMPRESSION: Nonspecific bowel gas pattern without dilated bowel loops or pneumoperitoneum. Question enteritis. Electronically Signed   By: Harmon Pier M.D.   On: 02/14/2018 16:21    Procedures Procedures  (including critical care time)  Medications Ordered in ED Medications  gi cocktail (Maalox,Lidocaine,Donnatal) (has no administration in time range)  sucralfate (CARAFATE) tablet 1 g (has no administration in time range)     Initial Impression / Assessment and Plan / ED Course  I have reviewed the triage vital signs and the nursing notes.  Pertinent labs & imaging results that were available during my care of the patient were reviewed by me and considered in my medical decision making (see chart for details).     Patient with ongoing abdominal pain, diarrhea, and nausea vomiting today.  Had a CT scan several weeks ago which did not show any acute findings.  Since then she has been seen here, [facility], primary care doctor, gastroenterologist.  Symptoms worsened after drinking beer this afternoon and eating lunch.  Pain is strictly epigastric and radiates all over.  Will try GI cocktail, get labs, get acute abdomen.   6:10 PM X-ray with no concern for free air or small bowel obstruction.  Labs unremarkable.  Urinalysis with no signs of infection or hematuria.  Pain improved with GI cocktail and Carafate.  I suspect patient may have gastritis versus peptic ulcer disease.  She is drinking in emergency department with no emesis.  I do not think she needs a CT today.  I will have her follow-up with her GI doctor.  We discussed not drinking alcohol, her level was 80 today.  Discussed no spicy or tomato  based foods.  Bland diet.  Follow-up as needed.  Vitals:   02/14/18 1546 02/14/18 1547 02/14/18 1801 02/14/18 1802  BP: 93/81  (!) 195/180 127/72  Pulse: 99  90   Resp: 18  18   Temp: 98.5 F (36.9 C)     TempSrc: Oral     SpO2: 95%  94%   Weight:  72.6 kg (160 lb)    Height:  5\' 3"  (1.6 m)      Final Clinical Impressions(s) / ED Diagnoses   Final diagnoses:  Nausea vomiting and diarrhea  Epigastric pain    ED Discharge Orders    None  Jaynie Crumble,  PA-C 02/14/18 2102    Tegeler, Canary Brim, MD 02/14/18 2204

## 2018-02-14 NOTE — Discharge Instructions (Addendum)
Take Maalox in addition to her current medications for your abdominal pain.  Please follow-up with gastroenterologist.  Avoid alcohol, avoid any spicy or tomato-based foods.  Bland diet.

## 2018-02-14 NOTE — ED Notes (Signed)
Bed: WA02 Expected date:  Expected time:  Means of arrival:  Comments: [age] F lower epigastric pain x2 months.

## 2018-02-14 NOTE — ED Triage Notes (Signed)
Pt is brought in by EMS from home with c/o upper abdominal pain with nausea, no vomiting or diarrhea---- onset an hour after she ate a [employer] food and drank beer.  Pt also reports that abdominal pain has been going on and off--- she sees a GI specialist for these; has had appendectomy and cholecystectomy.  Pt described pain as "burning".  Pt was given Zofran 4 mg IV and Fentanyl 100 mcg IV.

## 2018-07-09 ENCOUNTER — Institutional Professional Consult (permissible substitution): Payer: Medicaid Other | Admitting: Pulmonary Disease

## 2018-07-16 ENCOUNTER — Institutional Professional Consult (permissible substitution): Payer: Medicaid Other | Admitting: Pulmonary Disease

## 2018-07-16 NOTE — Progress Notes (Deleted)
Synopsis: Referred in *** for ***  Subjective:   PATIENT ID: Vanessa Haas GENDER: female DOB: 07-31-1961, MRN: [id number]   HPI  No chief complaint on file.   ***  Past Medical History:  Diagnosis Date  . Anxiety   . Bipolar 1 disorder, depressed (HCC)   . Cataract   . COPD (chronic obstructive pulmonary disease) (HCC)   . Hepatitis C    Type II  . Seizures (HCC)   . Substance abuse (HCC)    drug free x4 mos (cocaine)     No family history on file.   Social History   Socioeconomic History  . Marital status: Divorced    Spouse name: Not on file  . Number of children: Not on file  . Years of education: Not on file  . Highest education level: Not on file  Occupational History  . Not on file  Social Needs  . Financial resource strain: Not on file  . Food insecurity:    Worry: Not on file    Inability: Not on file  . Transportation needs:    Medical: Not on file    Non-medical: Not on file  Tobacco Use  . Smoking status: Former Smoker    Types: Cigarettes  . Smokeless tobacco: Never Used  Substance and Sexual Activity  . Alcohol use: No  . Drug use: Yes    Types: Cocaine    Comment: recent past- 4 mos ago stopped  . Sexual activity: Yes    Birth control/protection: Post-menopausal  Lifestyle  . Physical activity:    Days per week: Not on file    Minutes per session: Not on file  . Stress: Not on file  Relationships  . Social connections:    Talks on phone: Not on file    Gets together: Not on file    Attends religious service: Not on file    Active member of club or organization: Not on file    Attends meetings of clubs or organizations: Not on file    Relationship status: Not on file  . Intimate partner violence:    Fear of current or ex partner: Not on file    Emotionally abused: Not on file    Physically abused: Not on file    Forced sexual activity: Not on file  Other Topics Concern  . Not on file  Social History Narrative  . Not  on file     Allergies  Allergen Reactions  . Codeine Itching and Rash  . Levofloxacin Nausea And Vomiting    Patient states that it is too strong, will take if she has too. Can hurt kidneys   . Tape Rash    Blisters skin - paper tape is ok     Outpatient Medications Prior to Visit  Medication Sig Dispense Refill  . albuterol (PROVENTIL HFA;VENTOLIN HFA) 108 (90 Base) MCG/ACT inhaler Inhale 2 puffs into the lungs every 6 (six) hours as needed for wheezing or shortness of breath.    . budesonide-formoterol (SYMBICORT) 80-4.5 MCG/ACT inhaler Inhale 2 puffs into the lungs 2 (two) times daily.    Marland Kitchen buPROPion (WELLBUTRIN) 75 MG tablet Take 75 mg by mouth daily.    . clonazePAM (KLONOPIN) 1 MG tablet Take 1 mg by mouth 2 (two) times daily.   1  . dicyclomine (BENTYL) 20 MG tablet Take 20 mg by mouth 2 (two) times daily.  0  . ipratropium-albuterol (DUONEB) 0.5-2.5 (3) MG/3ML SOLN Take 3 mLs by  nebulization 4 (four) times daily as needed (shortness of breath).   1  . meloxicam (MOBIC) 7.5 MG tablet Take 7.5 mg by mouth daily.    . montelukast (SINGULAIR) 10 MG tablet Take 10 mg by mouth daily.  5  . oxyCODONE-acetaminophen (PERCOCET/ROXICET) 5-325 MG tablet Take 1 tablet by mouth every 6 (six) hours as needed for severe pain. 8 tablet 0  . pantoprazole (PROTONIX) 20 MG tablet Take 1 tablet (20 mg total) by mouth 2 (two) times daily before a meal. 60 tablet 0  . prazosin (MINIPRESS) 5 MG capsule Take 5 mg by mouth daily.    . Promethazine-DM 6.25-15 MG/5ML SOLN Take 5 mLs by mouth every 6 (six) hours as needed (cough).   1  . SUMAtriptan (IMITREX) 100 MG tablet Take 100 mg by mouth every 2 (two) hours as needed for migraine. May repeat in 2 hours if headache persists or recurs.    Marland Kitchen. tiotropium (SPIRIVA) 18 MCG inhalation capsule Place 18 mcg into inhaler and inhale daily as needed (shortness of breath).    . topiramate (TOPAMAX) 100 MG tablet Take 200 mg by mouth at bedtime.      No  facility-administered medications prior to visit.     ROS    Objective:  Physical Exam   There were no vitals filed for this visit.  ***  CBC    Component Value Date/Time   WBC 9.6 02/14/2018 1621   RBC 4.58 02/14/2018 1621   HGB 14.6 02/14/2018 1621   HCT 42.6 02/14/2018 1621   PLT 204 02/14/2018 1621   MCV 93.0 02/14/2018 1621   MCH 31.9 02/14/2018 1621   MCHC 34.3 02/14/2018 1621   RDW 12.6 02/14/2018 1621   LYMPHSABS 2.1 02/14/2018 1621   MONOABS 0.6 02/14/2018 1621   EOSABS 0.5 02/14/2018 1621   BASOSABS 0.2 (H) 02/14/2018 1621     Chest imaging:  PFT:  Labs:  Path:  Echo:  Heart Catheterization:       Assessment & Plan:   No diagnosis found.  Discussion: ***    Current Outpatient Medications:  .  albuterol (PROVENTIL HFA;VENTOLIN HFA) 108 (90 Base) MCG/ACT inhaler, Inhale 2 puffs into the lungs every 6 (six) hours as needed for wheezing or shortness of breath., Disp: , Rfl:  .  budesonide-formoterol (SYMBICORT) 80-4.5 MCG/ACT inhaler, Inhale 2 puffs into the lungs 2 (two) times daily., Disp: , Rfl:  .  buPROPion (WELLBUTRIN) 75 MG tablet, Take 75 mg by mouth daily., Disp: , Rfl:  .  clonazePAM (KLONOPIN) 1 MG tablet, Take 1 mg by mouth 2 (two) times daily. , Disp: , Rfl: 1 .  dicyclomine (BENTYL) 20 MG tablet, Take 20 mg by mouth 2 (two) times daily., Disp: , Rfl: 0 .  ipratropium-albuterol (DUONEB) 0.5-2.5 (3) MG/3ML SOLN, Take 3 mLs by nebulization 4 (four) times daily as needed (shortness of breath). , Disp: , Rfl: 1 .  meloxicam (MOBIC) 7.5 MG tablet, Take 7.5 mg by mouth daily., Disp: , Rfl:  .  montelukast (SINGULAIR) 10 MG tablet, Take 10 mg by mouth daily., Disp: , Rfl: 5 .  oxyCODONE-acetaminophen (PERCOCET/ROXICET) 5-325 MG tablet, Take 1 tablet by mouth every 6 (six) hours as needed for severe pain., Disp: 8 tablet, Rfl: 0 .  pantoprazole (PROTONIX) 20 MG tablet, Take 1 tablet (20 mg total) by mouth 2 (two) times daily before a  meal., Disp: 60 tablet, Rfl: 0 .  prazosin (MINIPRESS) 5 MG capsule, Take 5 mg by mouth  daily., Disp: , Rfl:  .  Promethazine-DM 6.25-15 MG/5ML SOLN, Take 5 mLs by mouth every 6 (six) hours as needed (cough). , Disp: , Rfl: 1 .  SUMAtriptan (IMITREX) 100 MG tablet, Take 100 mg by mouth every 2 (two) hours as needed for migraine. May repeat in 2 hours if headache persists or recurs., Disp: , Rfl:  .  tiotropium (SPIRIVA) 18 MCG inhalation capsule, Place 18 mcg into inhaler and inhale daily as needed (shortness of breath)., Disp: , Rfl:  .  topiramate (TOPAMAX) 100 MG tablet, Take 200 mg by mouth at bedtime. , Disp: , Rfl:

## 2018-08-20 ENCOUNTER — Institutional Professional Consult (permissible substitution): Payer: Medicaid Other | Admitting: Pulmonary Disease

## 2018-08-20 NOTE — Progress Notes (Deleted)
Synopsis: Referred in *** for ***  Subjective:   PATIENT ID: Vanessa Haas GENDER: female DOB: 07-31-1961, MRN: [id number]   HPI  No chief complaint on file.   ***  Past Medical History:  Diagnosis Date  . Anxiety   . Bipolar 1 disorder, depressed (HCC)   . Cataract   . COPD (chronic obstructive pulmonary disease) (HCC)   . Hepatitis C    Type II  . Seizures (HCC)   . Substance abuse (HCC)    drug free x4 mos (cocaine)     No family history on file.   Social History   Socioeconomic History  . Marital status: Divorced    Spouse name: Not on file  . Number of children: Not on file  . Years of education: Not on file  . Highest education level: Not on file  Occupational History  . Not on file  Social Needs  . Financial resource strain: Not on file  . Food insecurity:    Worry: Not on file    Inability: Not on file  . Transportation needs:    Medical: Not on file    Non-medical: Not on file  Tobacco Use  . Smoking status: Former Smoker    Types: Cigarettes  . Smokeless tobacco: Never Used  Substance and Sexual Activity  . Alcohol use: No  . Drug use: Yes    Types: Cocaine    Comment: recent past- 4 mos ago stopped  . Sexual activity: Yes    Birth control/protection: Post-menopausal  Lifestyle  . Physical activity:    Days per week: Not on file    Minutes per session: Not on file  . Stress: Not on file  Relationships  . Social connections:    Talks on phone: Not on file    Gets together: Not on file    Attends religious service: Not on file    Active member of club or organization: Not on file    Attends meetings of clubs or organizations: Not on file    Relationship status: Not on file  . Intimate partner violence:    Fear of current or ex partner: Not on file    Emotionally abused: Not on file    Physically abused: Not on file    Forced sexual activity: Not on file  Other Topics Concern  . Not on file  Social History Narrative  . Not  on file     Allergies  Allergen Reactions  . Codeine Itching and Rash  . Levofloxacin Nausea And Vomiting    Patient states that it is too strong, will take if she has too. Can hurt kidneys   . Tape Rash    Blisters skin - paper tape is ok     Outpatient Medications Prior to Visit  Medication Sig Dispense Refill  . albuterol (PROVENTIL HFA;VENTOLIN HFA) 108 (90 Base) MCG/ACT inhaler Inhale 2 puffs into the lungs every 6 (six) hours as needed for wheezing or shortness of breath.    . budesonide-formoterol (SYMBICORT) 80-4.5 MCG/ACT inhaler Inhale 2 puffs into the lungs 2 (two) times daily.    Marland Kitchen buPROPion (WELLBUTRIN) 75 MG tablet Take 75 mg by mouth daily.    . clonazePAM (KLONOPIN) 1 MG tablet Take 1 mg by mouth 2 (two) times daily.   1  . dicyclomine (BENTYL) 20 MG tablet Take 20 mg by mouth 2 (two) times daily.  0  . ipratropium-albuterol (DUONEB) 0.5-2.5 (3) MG/3ML SOLN Take 3 mLs by  nebulization 4 (four) times daily as needed (shortness of breath).   1  . meloxicam (MOBIC) 7.5 MG tablet Take 7.5 mg by mouth daily.    . montelukast (SINGULAIR) 10 MG tablet Take 10 mg by mouth daily.  5  . oxyCODONE-acetaminophen (PERCOCET/ROXICET) 5-325 MG tablet Take 1 tablet by mouth every 6 (six) hours as needed for severe pain. 8 tablet 0  . pantoprazole (PROTONIX) 20 MG tablet Take 1 tablet (20 mg total) by mouth 2 (two) times daily before a meal. 60 tablet 0  . prazosin (MINIPRESS) 5 MG capsule Take 5 mg by mouth daily.    . Promethazine-DM 6.25-15 MG/5ML SOLN Take 5 mLs by mouth every 6 (six) hours as needed (cough).   1  . SUMAtriptan (IMITREX) 100 MG tablet Take 100 mg by mouth every 2 (two) hours as needed for migraine. May repeat in 2 hours if headache persists or recurs.    Marland Kitchen. tiotropium (SPIRIVA) 18 MCG inhalation capsule Place 18 mcg into inhaler and inhale daily as needed (shortness of breath).    . topiramate (TOPAMAX) 100 MG tablet Take 200 mg by mouth at bedtime.      No  facility-administered medications prior to visit.     ROS    Objective:  Physical Exam   There were no vitals filed for this visit.  ***  CBC    Component Value Date/Time   WBC 9.6 02/14/2018 1621   RBC 4.58 02/14/2018 1621   HGB 14.6 02/14/2018 1621   HCT 42.6 02/14/2018 1621   PLT 204 02/14/2018 1621   MCV 93.0 02/14/2018 1621   MCH 31.9 02/14/2018 1621   MCHC 34.3 02/14/2018 1621   RDW 12.6 02/14/2018 1621   LYMPHSABS 2.1 02/14/2018 1621   MONOABS 0.6 02/14/2018 1621   EOSABS 0.5 02/14/2018 1621   BASOSABS 0.2 (H) 02/14/2018 1621     Chest imaging:  PFT:  Labs:  Path:  Echo:  Heart Catheterization:       Assessment & Plan:   No diagnosis found.  Discussion: ***    Current Outpatient Medications:  .  albuterol (PROVENTIL HFA;VENTOLIN HFA) 108 (90 Base) MCG/ACT inhaler, Inhale 2 puffs into the lungs every 6 (six) hours as needed for wheezing or shortness of breath., Disp: , Rfl:  .  budesonide-formoterol (SYMBICORT) 80-4.5 MCG/ACT inhaler, Inhale 2 puffs into the lungs 2 (two) times daily., Disp: , Rfl:  .  buPROPion (WELLBUTRIN) 75 MG tablet, Take 75 mg by mouth daily., Disp: , Rfl:  .  clonazePAM (KLONOPIN) 1 MG tablet, Take 1 mg by mouth 2 (two) times daily. , Disp: , Rfl: 1 .  dicyclomine (BENTYL) 20 MG tablet, Take 20 mg by mouth 2 (two) times daily., Disp: , Rfl: 0 .  ipratropium-albuterol (DUONEB) 0.5-2.5 (3) MG/3ML SOLN, Take 3 mLs by nebulization 4 (four) times daily as needed (shortness of breath). , Disp: , Rfl: 1 .  meloxicam (MOBIC) 7.5 MG tablet, Take 7.5 mg by mouth daily., Disp: , Rfl:  .  montelukast (SINGULAIR) 10 MG tablet, Take 10 mg by mouth daily., Disp: , Rfl: 5 .  oxyCODONE-acetaminophen (PERCOCET/ROXICET) 5-325 MG tablet, Take 1 tablet by mouth every 6 (six) hours as needed for severe pain., Disp: 8 tablet, Rfl: 0 .  pantoprazole (PROTONIX) 20 MG tablet, Take 1 tablet (20 mg total) by mouth 2 (two) times daily before a  meal., Disp: 60 tablet, Rfl: 0 .  prazosin (MINIPRESS) 5 MG capsule, Take 5 mg by mouth  daily., Disp: , Rfl:  .  Promethazine-DM 6.25-15 MG/5ML SOLN, Take 5 mLs by mouth every 6 (six) hours as needed (cough). , Disp: , Rfl: 1 .  SUMAtriptan (IMITREX) 100 MG tablet, Take 100 mg by mouth every 2 (two) hours as needed for migraine. May repeat in 2 hours if headache persists or recurs., Disp: , Rfl:  .  tiotropium (SPIRIVA) 18 MCG inhalation capsule, Place 18 mcg into inhaler and inhale daily as needed (shortness of breath)., Disp: , Rfl:  .  topiramate (TOPAMAX) 100 MG tablet, Take 200 mg by mouth at bedtime. , Disp: , Rfl:

## 2018-09-05 ENCOUNTER — Encounter: Payer: Self-pay | Admitting: *Deleted

## 2018-09-06 ENCOUNTER — Telehealth: Payer: Self-pay | Admitting: *Deleted

## 2018-09-06 ENCOUNTER — Ambulatory Visit: Payer: Medicaid Other | Admitting: Diagnostic Neuroimaging

## 2018-09-06 NOTE — Telephone Encounter (Signed)
Pt no showed for appt today.  

## 2018-09-09 ENCOUNTER — Encounter: Payer: Self-pay | Admitting: Diagnostic Neuroimaging

## 2018-10-24 ENCOUNTER — Encounter: Payer: Self-pay | Admitting: *Deleted

## 2018-10-27 ENCOUNTER — Telehealth: Payer: Self-pay | Admitting: *Deleted

## 2018-10-27 NOTE — Telephone Encounter (Signed)
Left message letting patient know our office will be closed, on 10/28/2018, to disinfect the facility. We will call back to reschedule the appointment that was scheduled for that day. 

## 2018-10-28 ENCOUNTER — Ambulatory Visit: Payer: Medicaid Other | Admitting: Diagnostic Neuroimaging

## 2018-10-28 NOTE — Telephone Encounter (Signed)
Attempted to call patient twice on only number in Epic.  Phone immediately rang busy both times.

## 2018-10-28 NOTE — Telephone Encounter (Signed)
Spoke with patient and advised her of need to reschedule. She stated that as a high risk patient she is not to go anywhere for at least a month. I rescheduled with her husband for afternoon apt at his request, in May. He understands to arrive 30 minutes early to check in, verbalized appreciation for call.

## 2018-12-12 ENCOUNTER — Telehealth: Payer: Self-pay | Admitting: Internal Medicine

## 2018-12-12 NOTE — Telephone Encounter (Signed)
Pt's husband is returning call. Cb is [phone].

## 2018-12-12 NOTE — Telephone Encounter (Signed)
If she is acutely ill then need to go first to ER to screen for covid before we can see her anyway so that's what I recommend if he / they are seeing an acute change for the worse.

## 2018-12-12 NOTE — Telephone Encounter (Signed)
Spoke w/ pt's husband, Jillyn Hidden, regarding MW's recommendations. Pt husband verbalized understanding. He stated pt was in the hospital recently and was already tested negative for COVID-19, however upon looking in her chart I did not see any labs drawn for COVID-19. I made pt's husband aware of this. He stated her saturations were 78% on RA & exertion. Pt is normally on 2L O2. States he increased her O2 up to 3L and her saturations resolved 95% with rest. Denies fever/chills/sweats. States her temp was 98*F at the last reading. Pt husband and pt states they do not think pt is acutely ill. I informed pt husband to maintain pt's O2 as she significantly desaturates without it. Husband expressed understanding.  Pt has a consult appt w/ MW on 11/18/2018 at 1:30 PM. I verified this with both the pt and her husband. I informed them should the pt have increased symptoms such as SOB, decrease in sats <90% with her O2, and/or spike in fever, pt needs to seek emergency care as recommended by MW. Pt and pt husband verbalized understanding.   Routing to MW as an Financial planner. Nothing further needed at this time.

## 2018-12-12 NOTE — Telephone Encounter (Signed)
Attempted to call patient, no answer, left message to call back.  

## 2018-12-12 NOTE — Telephone Encounter (Signed)
Returned call to patient spouse. She was previously scheduled with Dr. Kendrick Fries last year and no-showed. Pt is on 2 liters of 02. Spouse states when her 02 gets up to 95% he takes oxygen off. He checks periodically and if its low he puts oxygen back on. He has checked and found twice that it was 63%. She got up to go to restroom while we were making appt and sat dropped to 83 on 2 Liters.  She sat down and he bumped up to 3 Liters she came back up within secs to 93%.   Pt does not appear to be in any distress. It appears that he is really worked up about her numbers. She has been scheduled for next available consult on MW schedule 12/18/18. MW do you have any suggestions?

## 2018-12-17 ENCOUNTER — Telehealth: Payer: Self-pay | Admitting: *Deleted

## 2018-12-17 NOTE — Telephone Encounter (Signed)
Called patient and advised her that Due to current COVID 19 pandemic, our office is severely reducing in person visits in order to minimize the risk to our patients and healthcare providers. We recommend to convert your appointment to a video visit. She stated she does not have capability, is currently homebound and waiting for call back to tell her where to go to get tested for Covid 19. She asked to be rescheduled and called back with new appt date/time. Rescheduled for 02/11/19, 1:30 pm.  Called her back, and she wrote down new appt., verbalized understanding, appreciation.

## 2018-12-18 ENCOUNTER — Institutional Professional Consult (permissible substitution): Payer: Medicaid Other | Admitting: Internal Medicine

## 2018-12-31 ENCOUNTER — Ambulatory Visit: Payer: Self-pay | Admitting: Diagnostic Neuroimaging

## 2019-01-16 ENCOUNTER — Other Ambulatory Visit: Payer: Self-pay

## 2019-01-16 ENCOUNTER — Ambulatory Visit (INDEPENDENT_AMBULATORY_CARE_PROVIDER_SITE_OTHER): Payer: Medicaid Other | Admitting: Internal Medicine

## 2019-01-16 ENCOUNTER — Encounter: Payer: Self-pay | Admitting: Internal Medicine

## 2019-01-16 VITALS — BP 112/60 | HR 112 | Ht 63.0 in | Wt 163.0 lb

## 2019-01-16 DIAGNOSIS — J449 Chronic obstructive pulmonary disease, unspecified: Secondary | ICD-10-CM | POA: Diagnosis not present

## 2019-01-16 DIAGNOSIS — F17210 Nicotine dependence, cigarettes, uncomplicated: Secondary | ICD-10-CM | POA: Diagnosis not present

## 2019-01-16 MED ORDER — FLUTICASONE-UMECLIDIN-VILANT 100-62.5-25 MCG/INH IN AEPB: 1.0000 | INHALATION_SPRAY | Freq: Every morning | RESPIRATORY_TRACT | 11 refills | Status: DC

## 2019-01-16 MED ORDER — FLUTICASONE-UMECLIDIN-VILANT 100-62.5-25 MCG/INH IN AEPB: 1.0000 | INHALATION_SPRAY | Freq: Every day | RESPIRATORY_TRACT | 0 refills | Status: DC

## 2019-01-16 MED ORDER — ALBUTEROL SULFATE HFA 108 (90 BASE) MCG/ACT IN AERS: INHALATION_SPRAY | RESPIRATORY_TRACT | 11 refills | Status: DC

## 2019-01-16 NOTE — Progress Notes (Signed)
Vanessa Haas, female    DOB: [birth year],    MRN: [id number]   Brief patient profile:  54 yowf active smoker  With onset sob/cough around 2012 and progressively worse  on multiple (TNTC) inhalers/nebs since and noct 02 x 2018 so referred to pulmonary clinic 01/16/2019 by Norva Riffle      History of Present Illness  01/16/2019  Pulmonary/ 1st office eval/Jessee Mezera  Chief Complaint  Patient presents with   Consult    Consult NO:BSJG. States her oxygen drops to 63. She usually wears 2L. He states if he keeps the house temp cooler it helps with her breathing. Smokes 4 cigarettes per day.   Dyspnea:  Mostly rides cart when shops / doe x across the room, better with 02  Cough: "some but it's allergies" worse in ams better p neb/ min mucoid prod Sleep: disrupted at least once - twice noct and needs saba  SABA GEZ:MOQHUTM noted    No obvious day to day or daytime variability or assoc excess/ purulent sputum or mucus plugs or hemoptysis or cp or chest tightness, subjective wheeze or overt sinus or hb symptoms.     Also denies any obvious fluctuation of symptoms with weather or environmental changes or other aggravating or alleviating factors except as outlined above   No unusual exposure hx or h/o childhood pna/ asthma or knowledge of premature birth.  Current Allergies, Complete Past Medical History, Past Surgical History, Family History, and Social History were reviewed in [employer] record.  ROS  The following are not active complaints unless bolded Hoarseness, sore throat, dysphagia, dental problems, itching, sneezing,  nasal congestion or discharge of excess mucus or purulent secretions, ear ache,   fever, chills, sweats, unintended wt loss or wt gain, classically pleuritic or exertional cp,  orthopnea pnd or arm/hand swelling  or leg swelling, presyncope, palpitations, abdominal pain, anorexia, nausea, vomiting, diarrhea  or change in bowel habits or change in  bladder habits, change in stools or change in urine, dysuria, hematuria,  rash, arthralgias, visual complaints, headache, numbness, weakness or ataxia or problems with walking or coordination,  change in mood or  memory.           Past Medical History:  Diagnosis Date   Anxiety    Bipolar 1 disorder, depressed (HCC)    CAD (coronary artery disease)    Cataract    COPD (chronic obstructive pulmonary disease) (HCC)    Depression    Hepatitis C    Type II   Hyperlipidemia    Hypertension    Migraine    Neuropathy    Seizures (HCC)    Substance abuse (HCC)    drug free x4 mos (cocaine)    Outpatient Medications Prior to Visit  Medication Sig Dispense Refill   albuterol (PROVENTIL HFA;VENTOLIN HFA) 108 (90 Base) MCG/ACT inhaler Inhale 2 puffs into the lungs every 6 (six) hours as needed for wheezing or shortness of breath.     budesonide-formoterol (SYMBICORT) 80-4.5 MCG/ACT inhaler Inhale 2 puffs into the lungs 2 (two) times daily.     buPROPion (WELLBUTRIN) 75 MG tablet Take 75 mg by mouth daily.     citalopram (CELEXA) 40 MG tablet Take 40 mg by mouth daily.     clonazePAM (KLONOPIN) 1 MG tablet Take 1 mg by mouth 2 (two) times daily.   1   dicyclomine (BENTYL) 20 MG tablet Take 20 mg by mouth 2 (two) times daily.  0   Fluticasone-Salmeterol (ADVAIR)  100-50 MCG/DOSE AEPB Inhale 1 puff into the lungs 2 (two) times daily.     Fluticasone-Umeclidin-Vilant (TRELEGY ELLIPTA) 100-62.5-25 MCG/INH AEPB Inhale into the lungs.     ipratropium-albuterol (DUONEB) 0.5-2.5 (3) MG/3ML SOLN Take 3 mLs by nebulization 4 (four) times daily as needed (shortness of breath).   1   isosorbide mononitrate (IMDUR) 120 MG 24 hr tablet Take 120 mg by mouth daily.     montelukast (SINGULAIR) 10 MG tablet Take 10 mg by mouth daily.  5   QUEtiapine (SEROQUEL) 50 MG tablet Take 50 mg by mouth at bedtime.     SUMAtriptan (IMITREX) 100 MG tablet Take 100 mg by mouth every 2 (two) hours  as needed for migraine. May repeat in 2 hours if headache persists or recurs.     tiotropium (SPIRIVA) 18 MCG inhalation capsule Place 18 mcg into inhaler and inhale daily as needed (shortness of breath).     topiramate (TOPAMAX) 100 MG tablet Take 200 mg by mouth at bedtime.      ARIPiprazole (ABILIFY) 10 MG tablet Take 10 mg by mouth daily.     carvedilol (COREG) 3.125 MG tablet Take 3.125 mg by mouth 2 (two) times daily with a meal.     meloxicam (MOBIC) 7.5 MG tablet Take 7.5 mg by mouth daily.     oxyCODONE-acetaminophen (PERCOCET/ROXICET) 5-325 MG tablet Take 1 tablet by mouth every 6 (six) hours as needed for severe pain. (Patient not taking: Reported on 01/16/2019) 8 tablet 0   pantoprazole (PROTONIX) 20 MG tablet Take 1 tablet (20 mg total) by mouth 2 (two) times daily before a meal. (Patient not taking: Reported on 01/16/2019) 60 tablet 0   prazosin (MINIPRESS) 5 MG capsule Take 5 mg by mouth daily.     pregabalin (LYRICA) 300 MG capsule Take 300 mg by mouth 2 (two) times daily.     Promethazine-DM 6.25-15 MG/5ML SOLN Take 5 mLs by mouth every 6 (six) hours as needed (cough).   1      Objective:     BP 112/60    Pulse (!) 112    Ht  (1.6 m)    Wt 163 lb (73.9 kg)    SpO2 96%    BMI 28.87 kg/m   SpO2: 96 %  RA  amb hoarse wf nad with prominent pseudowheeze   HEENT: Edentulous / nl oropharynx. Nl external ear canals without cough reflex -  Mild bilateral non-specific turbinate edema     NECK :  without JVD/Nodes/TM/ nl carotid upstrokes bilaterally   LUNGS: no acc muscle use,  Mild barrel  contour chest wall with bilateral  Distant bs s audible wheeze and  without cough on insp or exp maneuver and mild  Hyperresonant  to  percussion bilaterally     CV:  RRR  no s3 or murmur or increase in P2, and no edema   ABD:  soft and nontender with pos mid insp Hoover's  in the supine position. No bruits or organomegaly appreciated, bowel sounds nl  MS:   Nl gait/  ext  warm without deformities, calf tenderness, cyanosis or clubbing No obvious joint restrictions   SKIN: warm and dry without lesions    NEURO:  alert, approp, nl sensorium with  no motor or cerebellar deficits apparent.       I personally reviewed images and agree with radiology impression as follows:   Chest CT w/o contrast 12/03/2018 Bibasilar atelectasis. No other confluent opacities or effusions.  Assessment   COPD GOLD ?  / group D / active smoker Active smoker - 01/16/2019  After extensive coaching inhaler device,  effectiveness =    90% with dpi, 50% with hfa   DDX of  difficult airways management almost all start with A and  include Adherence, Ace Inhibitors, Acid Reflux, Active Sinus Disease, Alpha 1 Antitripsin deficiency, Anxiety masquerading as Airways dz,  ABPA,  Allergy(esp in young), Aspiration (esp in elderly), Adverse effects of meds,  Active smoking or vaping, A bunch of PE's (a small clot burden can't cause this syndrome unless there is already severe underlying pulm or vascular dz with poor reserve) plus two Bs  = Bronchiectasis and Beta blocker use..and one C= CHF   Adherence is always the initial "prime suspect" and is a multilayered concern that requires a "trust but verify" approach in every patient - starting with knowing how to use medications, especially inhalers, correctly, keeping up with refills and understanding the fundamental difference between maintenance and prns vs those medications only taken for a very short course and then stopped and not refilled.  - see device teaching - extremely poor insight into how/when to use meds  - return with all meds in hand using a trust but verify approach to confirm accurate Medication  Reconciliation The principal here is that until we are certain that the  patients are doing what we've asked, it makes no sense to ask them to do more.   Active smoking also at top of list (see separate a/p)   ? Anxiety/depression >  usually at the bottom of this list of usual suspects but should be much higher on this pt's based on H and P and note already on psychotropics and may interfere with adherence and also interpretation of response or lack thereof to symptom management which can be quite subjective.   ?alleryg/ asthma component > continue singulair/ ICS/ add zyrtec prn "allergies"    ? Adverse drug effects > hoarseness probably from too many dpis though could also be due to gerd > try first minimize dpi = one trelegy only, the rest hfa or neb or smi prns   see avs for instructions unique to this ov  ? BB effects > low dose Coreg ok   ? CHF > echo ok 12/06/2018 in care everywhere/ reviewed      >>> f/u in 4 weeks with spirometry if  COVID - 19 restrictions have been lifted.        Cigarette smoker Counseled re importance of smoking cessation but did not meet time criteria for separate billing      Total time devoted to counseling  > 50 % of initial 60 min office visit:  reviewed case with pt/  device teaching which extended face to face time for this visit  discussion of options/alternatives/ personally creating written customized instructions  in presence of pt  then going over those specific  Instructions directly with the pt including how to use all of the meds but in particular covering each new medication in detail and the difference between the maintenance= "automatic" meds and the prns using an action plan format for the latter (If this problem/symptom => do that organization reading Left to right).  Please see AVS from this visit for a full list of these instructions which I personally wrote for this pt and  are unique to this visit.      Sandrea HughsMichael Anastacio Bua, MD 01/16/2019

## 2019-01-16 NOTE — Patient Instructions (Addendum)
Plan A = Automatic = Trelegy take two good drags - hold with right hand and hinge open   Rinse and gargle after use  Plan B = Backup Only use your albuterol inhaler as a rescue medication to be used if you can't catch your breath by resting or doing a relaxed purse lip breathing pattern.  - The less you use it, the better it will work when you need it. - Ok to use the inhaler up to 2 puffs  every 4 hours if you must but call for appointment if use goes up over your usual need - Don't leave home without it !!  (think of it like the spare tire for your car)   Plan C = Crisis - only use your albuterol-ipatropium (prefer pure albuterol)  nebulizer if you first try Plan B and it fails to help > ok to use the nebulizer up to every 4 hours but if start needing it regularly call for immediate appointment   Plan D = Doctor - call me if B and C not adequate  Plan E = ER - go to ER or call 911 if all else fails    For cough > mucinex dm up to1200 mg every 12 hours as needed    For nasal drainage/ throat irritation "allergy"  > zyrtec 10 mg daily as needed    Please schedule a follow up office visit in 4 weeks, call sooner if needed with all medications /inhalers/ solutions in hand so we can verify exactly what you are taking. This includes all medications from all doctors and over the counters - PLEASE separate them into two bags:  the ones you take automatically, no matter what, vs the ones you take just when you feel you need them "BAG #2 is UP TO YOU"  - this will really help [place] help you take your medications more effectively.

## 2019-01-17 ENCOUNTER — Encounter: Payer: Self-pay | Admitting: Internal Medicine

## 2019-01-17 DIAGNOSIS — F17210 Nicotine dependence, cigarettes, uncomplicated: Secondary | ICD-10-CM | POA: Insufficient documentation

## 2019-01-17 DIAGNOSIS — Z87891 Personal history of nicotine dependence: Secondary | ICD-10-CM | POA: Insufficient documentation

## 2019-01-17 NOTE — Assessment & Plan Note (Addendum)
Active smoker - 01/16/2019  After extensive coaching inhaler device,  effectiveness =    90% with dpi, 50% with hfa   DDX of  difficult airways management almost all start with A and  include Adherence, Ace Inhibitors, Acid Reflux, Active Sinus Disease, Alpha 1 Antitripsin deficiency, Anxiety masquerading as Airways dz,  ABPA,  Allergy(esp in young), Aspiration (esp in elderly), Adverse effects of meds,  Active smoking or vaping, A bunch of PE's (a small clot burden can't cause this syndrome unless there is already severe underlying pulm or vascular dz with poor reserve) plus two Bs  = Bronchiectasis and Beta blocker use..and one C= CHF   Adherence is always the initial "prime suspect" and is a multilayered concern that requires a "trust but verify" approach in every patient - starting with knowing how to use medications, especially inhalers, correctly, keeping up with refills and understanding the fundamental difference between maintenance and prns vs those medications only taken for a very short course and then stopped and not refilled.  - see device teaching - extremely poor insight into how/when to use meds  - return with all meds in hand using a trust but verify approach to confirm accurate Medication  Reconciliation The principal here is that until we are certain that the  patients are doing what we've asked, it makes no sense to ask them to do more.   Active smoking also at top of list (see separate a/p)   ? Anxiety/depression > usually at the bottom of this list of usual suspects but should be much higher on this pt's based on H and P and note already on psychotropics and may interfere with adherence and also interpretation of response or lack thereof to symptom management which can be quite subjective.   ?alleryg/ asthma component > continue singulair/ ICS/ add zyrtec prn "allergies"    ? Adverse drug effects > hoarseness probably from too many dpis though could also be due to gerd > try  first minimize dpi = one trelegy only, the rest hfa or neb or smi prns   see avs for instructions unique to this ov  ? BB effects > low dose Coreg ok   ? CHF > echo ok 12/06/2018 in care everywhere/ reviewed      >>> f/u in 4 weeks with spirometry if  COVID - 19 restrictions have been lifted.

## 2019-01-17 NOTE — Assessment & Plan Note (Signed)
Counseled re importance of smoking cessation but did not meet time criteria for separate billing    Total time devoted to counseling  > 50 % of initial 60 min office visit:  reviewed case with pt/   device teaching which extended face to face time for this visit discussion of options/alternatives/ personally creating written customized instructions  in presence of pt  then going over those specific  Instructions directly with the pt including how to use all of the meds but in particular covering each new medication in detail and the difference between the maintenance= "automatic" meds and the prns using an action plan format for the latter (If this problem/symptom => do that organization reading Left to right).  Please see AVS from this visit for a full list of these instructions which I personally wrote for this pt and  are unique to this visit.  

## 2019-02-03 NOTE — Telephone Encounter (Signed)
LVM requesting call back to confirm she is coming into office for appt Tues, June 30th or to reschedule for later this summer. I advised if she is coming in I need to give her instructions for in office visit.

## 2019-02-10 ENCOUNTER — Telehealth: Payer: Self-pay | Admitting: *Deleted

## 2019-02-10 NOTE — Telephone Encounter (Signed)
Called patient and informed  Her our office will still be closed on Fridays in July; her appt needs to be rescheduled. Her caregiver, Dominica Severin stated they can do a video visit on his smart phone.  I advised patient we'll  take all precautions to reduce any security or privacy concerns. This will be treated like an office visit, and we will file with your insurance.  She consented to video visit. His e mail: [email or web address]. E mail sent, and he confirmed he received e mail. Patient asked for reminder call.  I advised they'll get a call approximately 30 minutes before appt. Patient verbalized understanding, appreciation.

## 2019-02-11 ENCOUNTER — Ambulatory Visit: Payer: Self-pay | Admitting: Diagnostic Neuroimaging

## 2019-02-19 ENCOUNTER — Encounter: Payer: Self-pay | Admitting: Internal Medicine

## 2019-02-19 ENCOUNTER — Ambulatory Visit (INDEPENDENT_AMBULATORY_CARE_PROVIDER_SITE_OTHER): Payer: Medicaid Other | Admitting: Internal Medicine

## 2019-02-19 DIAGNOSIS — J449 Chronic obstructive pulmonary disease, unspecified: Secondary | ICD-10-CM

## 2019-02-19 DIAGNOSIS — F17210 Nicotine dependence, cigarettes, uncomplicated: Secondary | ICD-10-CM

## 2019-02-19 NOTE — Progress Notes (Signed)
Vanessa Haas, female    DOB: [birth year],    MRN: [id number]   Brief patient profile:  [age] yowf active smoker  With onset sob/cough around 2012 and progressively worse  on multiple (TNTC) inhalers/nebs since and noct 02 x 2018 so referred to pulmonary clinic 01/16/2019 by Norva RiffleAshley Vanstory      History of Present Illness  01/16/2019  Pulmonary/ 1st office eval/Mckynlie Vanderslice  Chief Complaint  Patient presents with   Consult    Consult WU:JWJXre:COPD. States her oxygen drops to 63. She usually wears 2L. He states if he keeps the house temp cooler it helps with her breathing. Smokes 4 cigarettes per day.   Dyspnea:  Mostly rides cart when shops / doe x across the room, better with 02  Cough: "some but it's allergies" worse in ams better p neb/ min mucoid prod Sleep: disrupted at least once - twice noct and needs saba  SABA BJY:NWGNFAOuse:overuse noted   rec Plan A = Automatic = Trelegy take two good drags - hold with right hand and hinge open  Plan B = Backup Only use your albuterol inhaler as a rescue medication Plan C = Crisis - only use your albuterol-ipatropium (prefer pure albuterol)  nebulizer if you first try Plan B and it fails to help > ok to use the nebulizer up to every 4 hours but if start needing it regularly call for immediate appointment For cough > mucinex dm up to1200 mg every 12 hours as needed  For nasal drainage/ throat irritation "allergy"  > zyrtec 10 mg daily as needed      Virtual Visit via Telephone Note 02/19/2019   I connected with Vanessa NoonLaurie B Haas on 02/19/19 at 10:45 AM EDT by telephone and verified that I am speaking with the correct person using two identifiers.   I discussed the limitations, risks, security and privacy concerns of performing an evaluation and management service by telephone and the availability of in person appointments. I also discussed with the patient that there may be a patient responsible charge related to this service. The patient expressed understanding and  agreed to proceed.   History of Present Illness: copd gold ? / still smoking   Dyspnea:  Still room to room  Cough: only in afternoons Sleeping: variably am congestion/ no purulent sputum   SABA use: a couple of times a day 02: has portable and machine not using either Called in for pred prn from pcp      No obvious day to day or daytime variability or assoc  purulent sputum or mucus plugs or hemoptysis or cp or chest tightness, subjective wheeze or overt sinus or hb symptoms.    Also denies any obvious fluctuation of symptoms with weather or environmental changes or other aggravating or alleviating factors except as outlined above.   Meds reviewed/ med reconciliation completed         Observations/Objective: Sounds minimally congested on the phone, speaking rapidly in full sentences    Assessment and Plan: See problem list for active a/p's   Follow Up Instructions: See avs for instructions unique to this ov which includes revised/ updated med list     I discussed the assessment and treatment plan with the patient. The patient was provided an opportunity to ask questions and all were answered. The patient agreed with the plan and demonstrated an understanding of the instructions.   The patient was advised to call back or seek an in-person evaluation if the symptoms worsen or  if the condition fails to improve as anticipated.  I provided 25  minutes of non-face-to-face time during this encounter.   [name], MD              Past Medical History:  Diagnosis Date   Anxiety    Bipolar 1 disorder, depressed ([place])    CAD (coronary artery disease)    Cataract    COPD (chronic obstructive pulmonary disease) ([place])    Depression    Hepatitis C    Type II   Hyperlipidemia    Hypertension    Migraine    Neuropathy    Seizures (HCC)    Substance abuse ([place])    drug free x4 mos (cocaine)

## 2019-02-19 NOTE — Assessment & Plan Note (Signed)
Active smoker - 01/16/2019  After extensive coaching inhaler device,  effectiveness =    90% with dpi, 50% with hfa   She reports subjective improvement on Trelegy but on the other hand is already called in for another round of prednisone for her primary doctor for her what she calls her "allergies".  I do suspect what she means by that is nasal congestion with postnasal drip syndrome causing increasing cough and chest congestion.  The one problem I have with dry powder inhalers as they do not address the cough as well as other forms of inhaled bronchodilators and may need to be therefore switched over to either [facility] or [facility] on her next visit.

## 2019-02-19 NOTE — Patient Instructions (Addendum)
No change in medications for  Now  The key is to stop smoking completely before smoking completely stops you!      Please schedule a follow up office visit in 6 weeks, call sooner if needed with all medications /inhalers/ solutions in hand so we can verify exactly what you are taking. This includes all medications from all doctors and over the counters

## 2019-02-19 NOTE — Assessment & Plan Note (Signed)
Counseled re importance of smoking cessation but did not meet time criteria for separate billing     Each maintenance medication was reviewed in detail including most importantly the difference between maintenance and as needed and under what circumstances the prns are to be used.  Please see AVS for specific  Instructions which are unique to this visit and I personally typed out  which were reviewed in detail over the phone with the patient and a copy provided.     >>> f/u in 6 weeks with all meds in hand using a trust but verify approach to confirm accurate Medication  Reconciliation The principal here is that until we are certain that the  patients are doing what we've asked, it makes no sense to ask them to do more.

## 2019-02-20 ENCOUNTER — Other Ambulatory Visit: Payer: Self-pay | Admitting: *Deleted

## 2019-02-20 ENCOUNTER — Encounter: Payer: Self-pay | Admitting: *Deleted

## 2019-02-24 ENCOUNTER — Ambulatory Visit: Payer: Self-pay | Admitting: Diagnostic Neuroimaging

## 2019-02-24 ENCOUNTER — Telehealth: Payer: Self-pay | Admitting: Diagnostic Neuroimaging

## 2019-02-24 NOTE — Telephone Encounter (Signed)
Called patient, spoke with her and husband, Vanessa Haas who stated they have no means to do video. He said his smart phone broke. They do not want her to be outside due to her COPD. He stated Caryl Pina PA is prescribing topamax for her seizures, so they will continue to see her for refills for another 3-4 months. He stated they want to wait until the pandemic is under better control. I advised him that they can call back when she is ready to come into office to be seen. He stated he would call [facility] , verbalized understanding, appreciation. I gave referral to [facility], new pt referrals and requested she notify PCP.

## 2019-02-24 NOTE — Telephone Encounter (Signed)
Pt is not able to come in for her appt due to her only having a flip phone and her having COPD and this COVID-19 Pandemic. Pt is wanting RN to call her to see what can be done since she is a new pt and is not able to come to a In Office appt and her knowing that some things can not be done over the phone. Please advise.

## 2019-02-24 NOTE — Telephone Encounter (Signed)
Called patient to discuss getting her rescheduled for video visit. She stated she has COPD and will go outdoors in this weather. I advised her that previously Vanessa Haas, her husband stated he had a smart phone and would help with video visit. She stated they are trying to figure it out, but  he's not home. She requested I call back after 1 pm. I told her I will. They do not own a computer. She verbalized understanding, appreciation.

## 2019-02-25 ENCOUNTER — Telehealth: Payer: Self-pay | Admitting: Diagnostic Neuroimaging

## 2019-02-25 NOTE — Telephone Encounter (Signed)
Noted, she has cancelled due to COPD and inability to do video visit.

## 2019-02-25 NOTE — Telephone Encounter (Signed)
FYI- patient has no-showed twice to new patient appointments in 2020.

## 2019-03-14 ENCOUNTER — Ambulatory Visit: Payer: Self-pay | Admitting: Diagnostic Neuroimaging

## 2019-04-03 ENCOUNTER — Other Ambulatory Visit: Payer: Self-pay

## 2019-04-03 ENCOUNTER — Encounter: Payer: Self-pay | Admitting: Internal Medicine

## 2019-04-03 ENCOUNTER — Ambulatory Visit (INDEPENDENT_AMBULATORY_CARE_PROVIDER_SITE_OTHER): Payer: Medicaid Other | Admitting: Internal Medicine

## 2019-04-03 DIAGNOSIS — J449 Chronic obstructive pulmonary disease, unspecified: Secondary | ICD-10-CM

## 2019-04-03 DIAGNOSIS — F17210 Nicotine dependence, cigarettes, uncomplicated: Secondary | ICD-10-CM

## 2019-04-03 NOTE — Progress Notes (Signed)
Vanessa Haas, female    DOB: 1960/10/09     MRN: [id number]   Brief patient profile:  [age] yowf active smoker  With onset sob/cough around 2012 and progressively worse  on multiple (TNTC) inhalers/nebs since and noct 02 x 2018 so referred to pulmonary clinic 01/16/2019 by Norva RiffleAshley Haas      History of Present Illness  01/16/2019  Pulmonary/ 1st office eval/[facility]  Chief Complaint  Patient presents with  . Consult    Consult WU:JWJXre:COPD. States her oxygen drops to 63. She usually wears 2L. He states if he keeps the house temp cooler it helps with her breathing. Smokes 4 cigarettes per day.   Dyspnea:  Mostly rides cart when shops / doe x across the room, better with 02  Cough: "some but it's allergies" worse in ams better p neb/ min mucoid prod Sleep: disrupted at least once - twice noct and needs saba  SABA BJY:NWGNFAOuse:overuse noted   rec Plan A = Automatic = Trelegy take two good drags - hold with right hand and hinge open  Plan Haas = Backup Only use your albuterol inhaler as a rescue medication Plan C = Crisis - only use your albuterol-ipatropium (prefer pure albuterol)  nebulizer if you first try Plan Haas and it fails to help > ok to use the nebulizer up to every 4 hours but if start needing it regularly call for immediate appointment For cough > mucinex dm up to1200 mg every 12 hours as needed  For nasal drainage/ throat irritation "allergy"  > zyrtec 10 mg daily as needed      Virtual Visit via Telephone Note 02/19/2019   I connected with Vanessa Haas on 02/19/19 at 10:45 AM EDT by telephone and verified that I am speaking with the correct person using two identifiers.   I discussed the limitations, risks, security and privacy concerns of performing an evaluation and management service by telephone and the availability of in person appointments. I also discussed with the patient that there may be a patient responsible charge related to this service. The patient expressed understanding and  agreed to proceed.   History of Present Illness: copd gold ? / still smoking   Dyspnea:  Still room to room  Cough: only in afternoons Sleeping: variably am congestion/ no purulent sputum   SABA use: a couple of times a day 02: has portable and machine not using either Called in for pred prn from pcp  rec No change in medications for  Now The key is to stop smoking completely before smoking completely stops you! Please schedule a follow up office visit in 6 weeks, call sooner if needed with all medications /inhalers/ solutions in hand so we can verify exactly what you are taking. This includes all medications from all doctors and over the counters    Virtual Visit via Telephone Note 04/03/2019   I connected with Vanessa Haas on 04/03/19 at  [phone]  by telephone and verified that I am speaking with the correct person using two identifiers.   I discussed the limitations, risks, security and privacy concerns of performing an evaluation and management service by telephone and the availability of in person appointments. I also discussed with the patient that there may be a patient responsible charge related to this service. The patient expressed understanding and agreed to proceed.   History of Present Illness: copd ? Gold on trelegy/ still smoking  Dyspnea:  Improved vs last visit Cough: better if stays  inside/ no am  Sleeping: better  SABA use: maybe once a day hfa if stays inside, apparently still using duoneb twice daily for reasons that aren't clear  02: just prn    No obvious other patterns  day to day or daytime variability or assoc excess/ purulent sputum or mucus plugs or hemoptysis or cp or chest tightness, subjective wheeze or overt sinus or hb symptoms.    Also denies any obvious fluctuation of symptoms with weather or environmental changes or other aggravating or alleviating factors except as outlined above.   Meds reviewed/ med reconciliation completed          Observations/Objective: Sounds fine on the phone, no rattling cough / speaking full sentences    Assessment and Plan: See problem list for active a/p's   Follow Up Instructions: See avs for instructions unique to this ov which includes revised/ updated med list     I discussed the assessment and treatment plan with the patient. The patient was provided an opportunity to ask questions and all were answered. The patient agreed with the plan and demonstrated an understanding of the instructions.   The patient was advised to call back or seek an in-person evaluation if the symptoms worsen or if the condition fails to improve as anticipated.  I provided 25 minutes of non-face-to-face time during this encounter.   Vanessa Gully, MD                  Past Medical History:  Diagnosis Date  . Anxiety   . Bipolar 1 disorder, depressed ([place])   . CAD (coronary artery disease)   . Cataract   . COPD (chronic obstructive pulmonary disease) ([place])   . Depression   . Hepatitis C    Type II  . Hyperlipidemia   . Hypertension   . Migraine   . Neuropathy   . Seizures ([place])   . Substance abuse ([place])    drug free x4 mos (cocaine)

## 2019-04-03 NOTE — Assessment & Plan Note (Addendum)
Counseled re importance of smoking cessation but did not meet time criteria for separate billing     Each maintenance medication was reviewed in detail including most importantly the difference between maintenance and as needed and under what circumstances the prns are to be used.  Please see AVS for specific  Instructions which are unique to this visit and I personally typed out  which were reviewed in detail over the phone  with the patient and a copy provided in writing

## 2019-04-03 NOTE — Assessment & Plan Note (Signed)
Active smoker - 01/16/2019  After extensive coaching inhaler device,  effectiveness =    90% with dpi, 50% with hfa   Still confused with maint vs prns > see avs for instructions unique to this ov     Group D in terms of symptom/risk and laba/lama/ICS  therefore appropriate rx at this point >>>  Continue trelegy

## 2019-04-03 NOTE — Patient Instructions (Signed)
Plan A = Automatic = trelegy one click each am   Plan B = Backup Only use your albuterol inhaler as a rescue medication to be used if you can't catch your breath by resting or doing a relaxed purse lip breathing pattern.  - The less you use it, the better it will work when you need it. - Ok to use the inhaler up to 2 puffs  every 4 hours if you must but call for appointment if use goes up over your usual need - Don't leave home without it !!  (think of it like the spare tire for your car)   Plan C = Crisis - only use your albuterol nebulizer (NOT duoneb=  combined with ipatropium)  if you first try Plan B and it fails to help > ok to use the nebulizer up to every 4 hours but if start needing it regularly call for immediate appointment     Please schedule a follow up visit in 3 months but call sooner if needed - ok to do televisit if pandemic still an issue

## 2019-06-12 ENCOUNTER — Other Ambulatory Visit: Payer: Self-pay

## 2019-06-12 ENCOUNTER — Encounter: Payer: Self-pay | Admitting: Internal Medicine

## 2019-06-12 ENCOUNTER — Ambulatory Visit (INDEPENDENT_AMBULATORY_CARE_PROVIDER_SITE_OTHER): Payer: Medicaid Other | Admitting: Internal Medicine

## 2019-06-12 DIAGNOSIS — F17210 Nicotine dependence, cigarettes, uncomplicated: Secondary | ICD-10-CM | POA: Diagnosis not present

## 2019-06-12 DIAGNOSIS — J9611 Chronic respiratory failure with hypoxia: Secondary | ICD-10-CM

## 2019-06-12 DIAGNOSIS — J449 Chronic obstructive pulmonary disease, unspecified: Secondary | ICD-10-CM | POA: Diagnosis not present

## 2019-06-12 NOTE — Patient Instructions (Signed)
02 should be 2lpm at bedtime and goal is to keep it above 90% during the day with activity and  at rest by adjusting your flow rate   The key is to stop smoking completely before smoking completely stops you!  You can use albuterol up to every 4 hours as needed  - first try the inhaler and then if not relieved at rest, use the nebulizer next (wait 15 minutes after the inhalers first to see if it helped)   We need you to see one of our NP's when we have our pharmacist on site with your DRUG FORMULARY in hand to pick the best inhalers/nebulizer solutions for your insurance given that they won't pay for trelegy any more

## 2019-06-12 NOTE — Progress Notes (Signed)
Vanessa Haas, female    DOB: 11/04/1960     MRN: [id number]   Brief patient profile:  35 yowf active smoker  With onset sob/cough around 2012 and progressively worse  on multiple (TNTC) inhalers/nebs since and noct 02 x 2018 so referred to pulmonary clinic 01/16/2019 by Norva Riffle      History of Present Illness  01/16/2019  Pulmonary/ 1st office eval/[facility]  Chief Complaint  Patient presents with  . Consult    Consult ST:MHDQ. States her oxygen drops to 63. She usually wears 2L. He states if he keeps the house temp cooler it helps with her breathing. Smokes 4 cigarettes per day.   Dyspnea:  Mostly rides cart when shops / doe x across the room, better with 02  Cough: "some but it's allergies" worse in ams better p neb/ min mucoid prod Sleep: disrupted at least once - twice noct and needs saba  SABA QIW:LNLGXQJ noted   rec Plan A = Automatic = Trelegy take two good drags - hold with right hand and hinge open  Plan B = Backup Only use your albuterol inhaler as a rescue medication Plan C = Crisis - only use your albuterol-ipatropium (prefer pure albuterol)  nebulizer if you first try Plan B and it fails to help > ok to use the nebulizer up to every 4 hours but if start needing it regularly call for immediate appointment For cough > mucinex dm up to1200 mg every 12 hours as needed  For nasal drainage/ throat irritation "allergy"  > zyrtec 10 mg daily as needed      Virtual Visit via Telephone Note 02/19/2019   I connected with Vanessa Haas on 02/19/19 at 10:45 AM EDT by telephone and verified that I am speaking with the correct person using two identifiers.   I discussed the limitations, risks, security and privacy concerns of performing an evaluation and management service by telephone and the availability of in person appointments. I also discussed with the patient that there may be a patient responsible charge related to this service. The patient expressed understanding and  agreed to proceed.   History of Present Illness: copd gold ? / still smoking   Dyspnea:  Still room to room  Cough: only in afternoons Sleeping: variably am congestion/ no purulent sputum   SABA use: a couple of times a day 02: has portable and machine not using either Called in for pred prn from pcp  rec No change in medications for  Now The key is to stop smoking completely before smoking completely stops you! Please schedule a follow up office visit in 6 weeks, call sooner if needed with all medications /inhalers/ solutions in hand so we can verify exactly what you are taking. This includes all medications from all doctors and over the counters    Virtual Visit via Telephone Note 04/03/2019 History of Present Illness: copd ? Gold on trelegy/ still smoking Dyspnea:  Improved vs last visit Cough: better if stays inside/ no am  Sleeping: better  SABA use: maybe once a day hfa if stays inside, apparently still using duoneb twice daily for reasons that aren't clear  rec Plan A = Automatic = trelegy one click each am  Plan B = Backup Only use your albuterol inhaler as a rescue medication  Plan C = Crisis - only use your albuterol nebulizer (NOT duoneb=  combined with ipatropium)  if you first try Plan B and it fails to help > ok  to use the nebulizer up to every 4 hours but if start needing it regularly call for immediate appointment   Virtual Visit via Telephone Note 06/12/2019   I connected with Vanessa Haas on 06/12/19 at   815 AM EDT by telephone and verified that I am speaking with the correct person using two identifiers.   I discussed the limitations, risks, security and privacy concerns of performing an evaluation and management service by telephone and the availability of in person appointments. I also discussed with the patient that there may be a patient responsible charge related to this service. The patient expressed understanding and agreed to proceed.   History of  Present Illness: still smoking but less  Dyspnea:  Was better until ran out of trelegy around the 1st of Oct > aecopd presently on pred/amox Cough: more since stopped trelegy clear  Sleeping: once or twice needs neb while flat, 2 pillows  SABA use: one twice daytime daily when active  02: 2 lpm hs, as needed during the day    No obvious day to day or daytime variability or assoc excess/ purulent sputum or mucus plugs or hemoptysis or cp or chest tightness, subjective wheeze or overt sinus or hb symptoms.    Also denies any obvious fluctuation of symptoms with weather or environmental changes or other aggravating or alleviating factors except as outlined above.   Meds reviewed/ med reconciliation completed  Topamax, serequel, pred, albuterol hhfa and neb         Observations/Objective: 97% at rest per husband  Sounds fine on the phone at rest s rattling cough/ good phonatin       Past Medical History:  Diagnosis Date  . Anxiety   . Bipolar 1 disorder, depressed ([place])   . CAD (coronary artery disease)   . Cataract   . COPD (chronic obstructive pulmonary disease) ([place])   . Depression   . Hepatitis C    Type II  . Hyperlipidemia   . Hypertension   . Migraine   . Neuropathy   . Seizures ([place])   . Substance abuse ([place])    drug free x4 mos (cocaine)              Assessment and Plan: See problem list for active a/p's   Follow Up Instructions: See avs for instructions unique to this ov which includes revised/ updated med list     I discussed the assessment and treatment plan with the patient. The patient was provided an opportunity to ask questions and all were answered. The patient agreed with the plan and demonstrated an understanding of the instructions.   The patient was advised to call back or seek an in-person evaluation if the symptoms worsen or if the condition fails to improve as anticipated.  I provided 30 minutes of non-face-to-face time during this  encounter.   Vanessa Gully, MD

## 2019-06-12 NOTE — Assessment & Plan Note (Signed)
Apparently  Adequate control on present rx, reviewed in detail with pt > no change in rx needed  = 2lpm hs and titrate daytime to keep sats > 90%   Advised running concentrator when not using the NP is a waste of money   Each maintenance medication was reviewed in detail including most importantly the difference between maintenance and as needed and under what circumstances the prns are to be used.  Please see AVS for specific  Instructions which are unique to this visit and I personally typed out  which were reviewed in detail over the phone with the patient and a copy provided by mail

## 2019-06-12 NOTE — Assessment & Plan Note (Signed)
Counseled re importance of smoking cessation but did not meet time criteria for separate billing   °

## 2019-06-12 NOTE — Assessment & Plan Note (Signed)
Active smoker - 01/16/2019  After extensive coaching inhaler device,  effectiveness =    90% with dpi, 50% with hfa    Group D in terms of symptom/risk and laba/lama/ICS  therefore appropriate rx at this point >>>  Was clearly  better while on trelegy but not on formulary  Advised:  formulary restrictions will be an ongoing challenge for the forseable future and I would be happy to pick an alternative if the pt will first  provide me a list of them -  pt  will need to return here for training for any new device that is required eg dpi vs hfa vs respimat.    In the meantime we can always provide samples so that the patient never runs out of any needed respiratory medications.    >>>ov with our pharmacist with formulary in hand if possible

## 2019-06-27 ENCOUNTER — Telehealth: Payer: Self-pay | Admitting: Pharmacist

## 2019-06-27 NOTE — Telephone Encounter (Signed)
Called pt on 06/27/2019 at 10:19AM and left HIPAA-compliant VM with instructions to call [place] clinic back to reschedule pharmacy appt for any other time EXCEPT between 11AM-12PM on 06/30/2019.   Drexel Iha, PharmD PGY2 Ambulatory Care Pharmacy Resident

## 2019-06-28 NOTE — Progress Notes (Cosign Needed)
 This encounter was created in error - please disregard.

## 2019-06-30 ENCOUNTER — Ambulatory Visit: Payer: Medicaid Other | Admitting: Pulmonary Disease

## 2019-06-30 ENCOUNTER — Ambulatory Visit: Payer: Medicaid Other

## 2019-06-30 ENCOUNTER — Other Ambulatory Visit: Payer: Self-pay

## 2019-06-30 NOTE — Patient Instructions (Addendum)
It was a pleasure seeing you in clinic today Ms. Codrington!  Today the plan is...  1.   Please call the PharmD clinic at [phone] if you have any questions that you would like to speak with a pharmacist about [facility] Kidney, Museum/gallery conservator).

## 2019-07-01 ENCOUNTER — Telehealth: Payer: Self-pay | Admitting: Pharmacy Technician

## 2019-07-01 NOTE — Telephone Encounter (Signed)
Submitted a Non-formulary Prior Authorization request to [facility] Medicaid for Trelegy Ellipta 100 via  [employer]. Will update once we receive a response.  8:39 AM Beatriz Chancellor, CPhT

## 2019-07-02 NOTE — Telephone Encounter (Signed)
Received notification from [facility] Medicaid regarding a prior authorization for Trelegy Ellipta 100. Authorization has been APPROVED from 07/01/2019 to 06/30/2020.   Will send document to scan center.  Authorization # [id number]   8:33 AM Beatriz Chancellor, CPhT

## 2019-07-04 ENCOUNTER — Ambulatory Visit: Payer: Medicaid Other

## 2019-07-04 ENCOUNTER — Ambulatory Visit: Payer: Medicaid Other | Admitting: Pulmonary Disease

## 2019-07-15 IMAGING — CR DG ABDOMEN ACUTE W/ 1V CHEST
3 series · 3 of 3 positions shown · non-contrast
Comparison: 11/19/2017 chest radiograph and prior studies

CLINICAL DATA: 56-year-old female with acute abdominal pain and
nausea.

EXAM:
DG ABDOMEN ACUTE W/ 1V CHEST

[w chest pa]
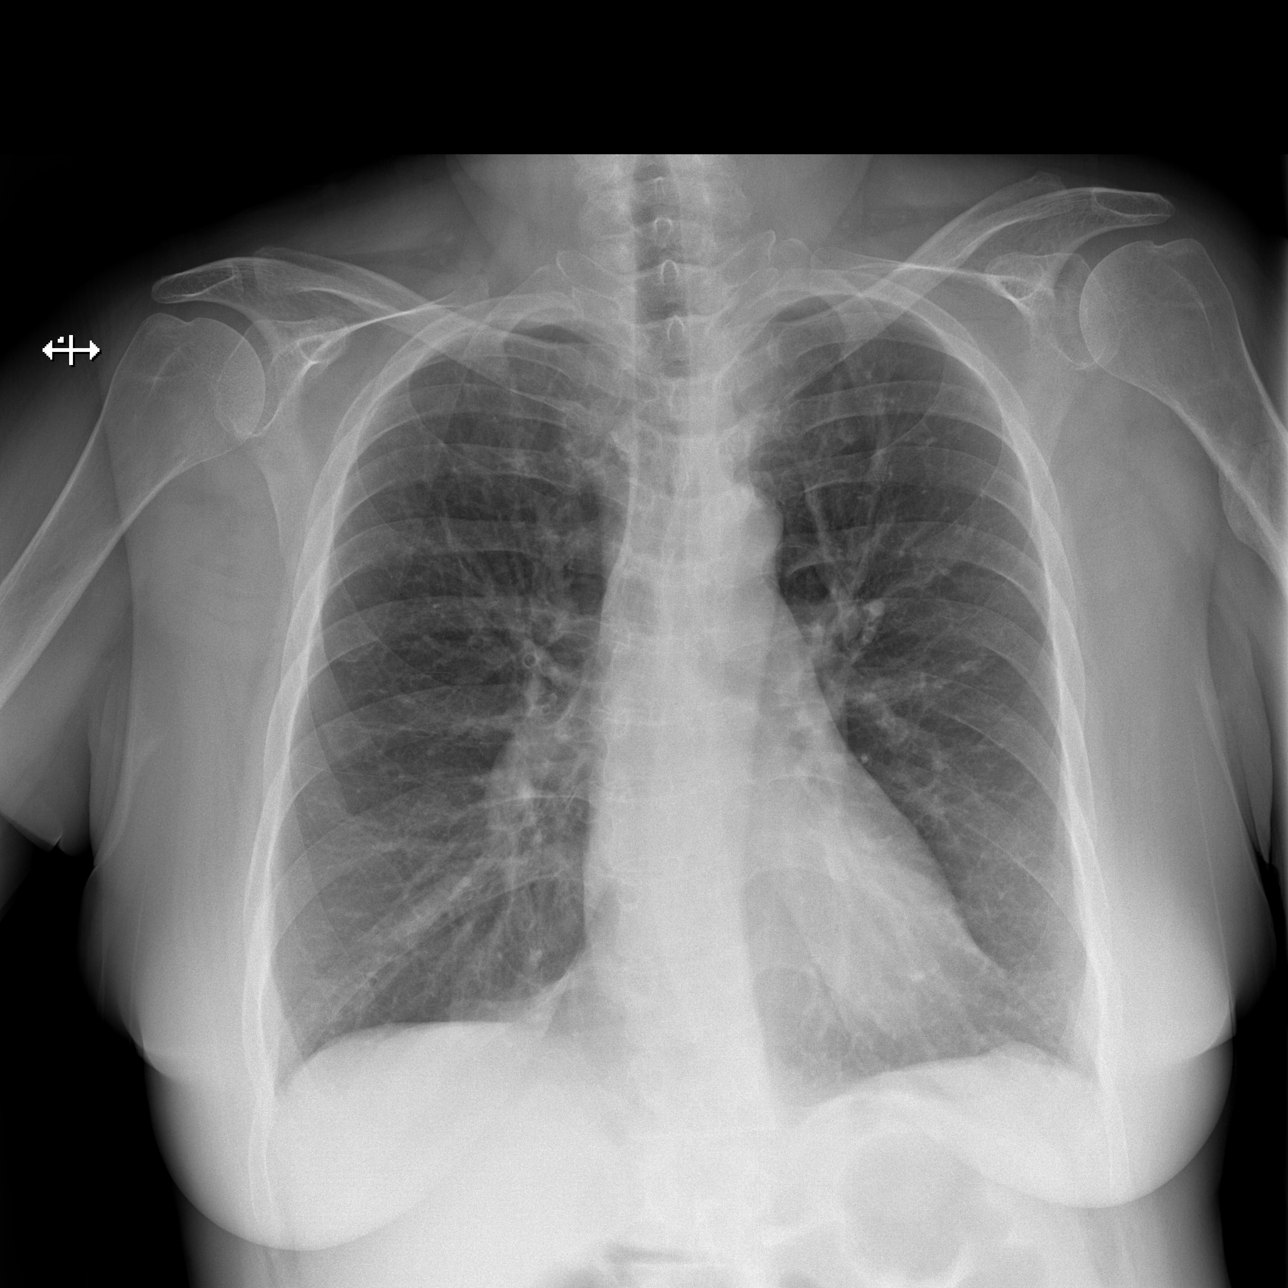

[w abdomen upright]
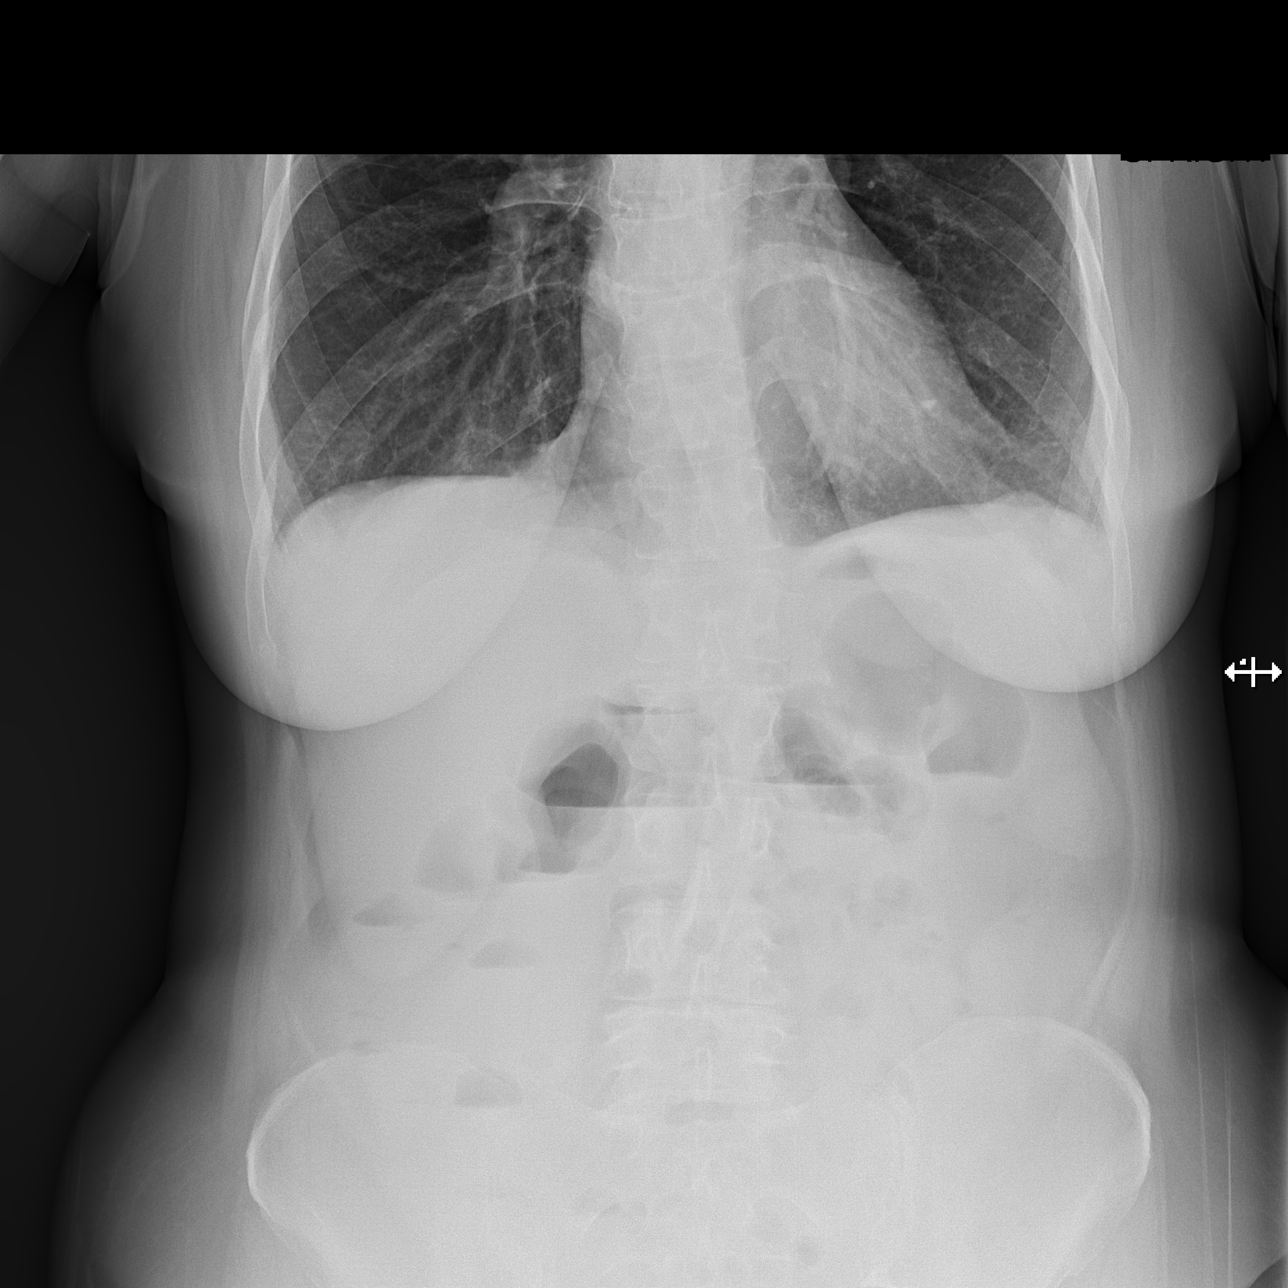

[t abdomen supine]
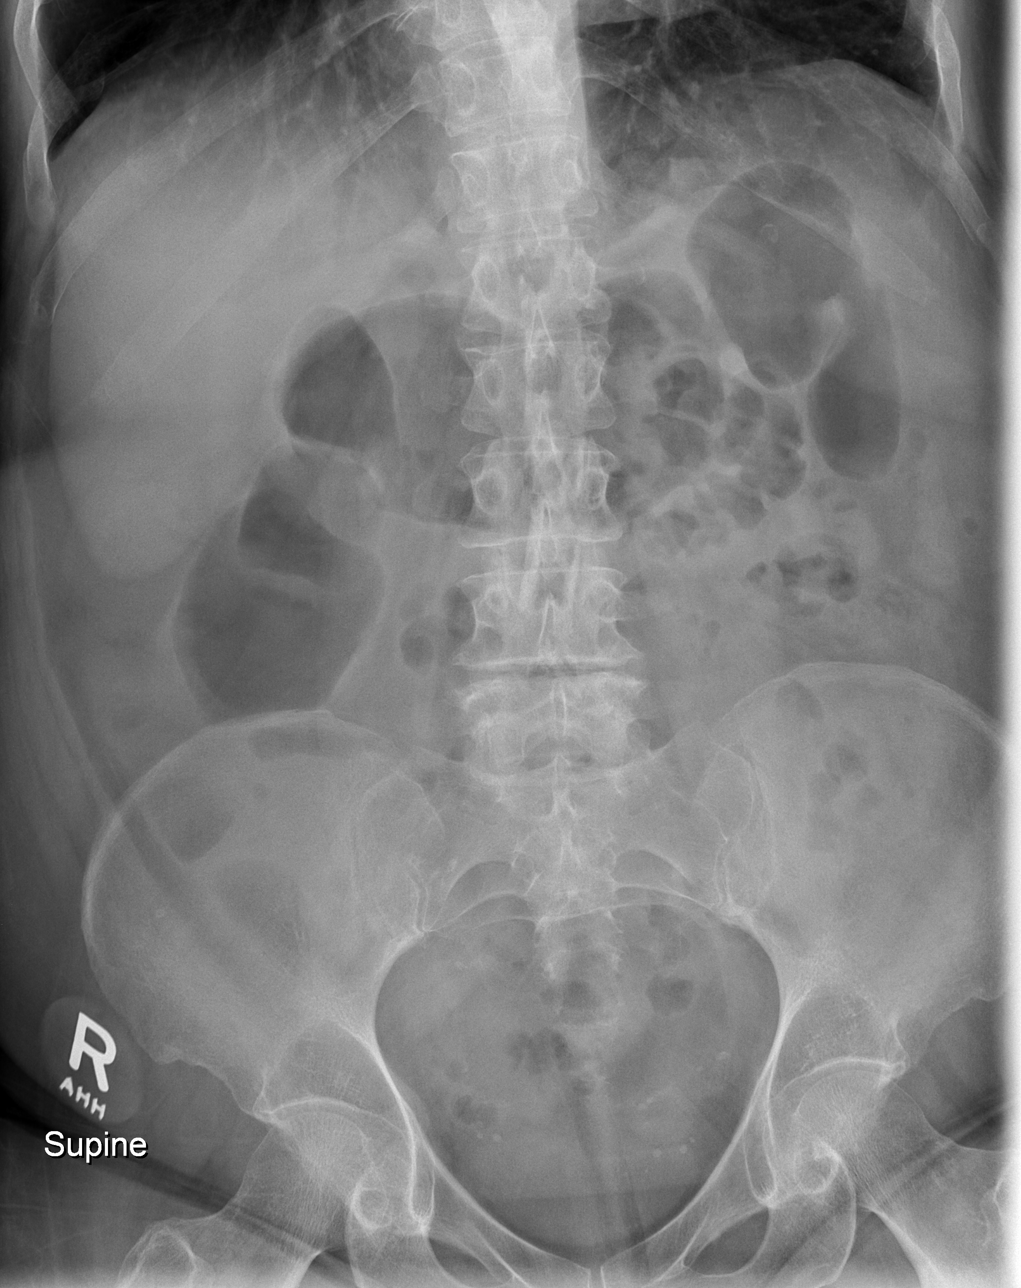

[3 of 3 positions shown; findings below may reference images not displayed]

FINDINGS: Nondistended gas and fluid-filled loops of bowel are noted.

Small amount of gas in the colon and rectum noted.

No evidence of pneumoperitoneum or suspicious calcifications.

Cardiomediastinal silhouette is unremarkable.

The lungs are clear.

No pleural effusion or pneumothorax.

No acute bony abnormalities.
IMPRESSION: Nonspecific bowel gas pattern without dilated bowel loops or
pneumoperitoneum. Question enteritis.

## 2019-07-24 NOTE — Progress Notes (Signed)
   Subjective Patient presents to [facility] and seen by the pharmacist for smoking cessation counseling.   Patient was referred for financial assistance/smoking cessation Dr. Melvyn Novas on 06/12/2019. Frederik Schmidt, certified pharmacy technician, has submitted a prior authorization for Trelegy Ellipta. Prior authorization was approved for 07/28/19 to 07/27/20.  Pt presents for initial appt with pharmacy team with her husband (permission given). Patient states she has stopped taking most of her medications in Sept 2020 due to stomach upset. She states she has not used Trelegy Ellipta since July 2020. She states she is using Mucinex DM "religiously". She is using her rescue inhaler 4x per day and her nebulizer 3-4x per day. She states she quit smoking last Monday "cold [place]", however, still feels horrible. She is not interested in discussing smoking cessation at this time. She is not interested in using NRT, Chantix, or bupropion to quit smoking. She would prefer to continue quitting "cold [place]". Patient states she is interested in pursuing pulmonary rehab in the future, but would like to wait until COVID-19 pandemic has resolved before starting.  Patient states she knows how to use her Trelegy Ellipta inhaler and would not like to be counseled on its use. She confirms she rinses her mouth out after using. She states she has used the [employer] in the past and is not interested in using at this time to see if she is able to take an appropriate breath from Ellipta inhaler. She states she knows the Trelegy Ellipta is effective for helping her breathing because she used it in the past and "it worked".  Assessment and Plan  1. Smoking Cessation Patient quit smoking on 07/21/2019 via "cold [place]" method. She is not interested in discussing smoking cessation management. She is not interested inusing Chantix, bupropion, nicotine patch, nicotine gum, and/or nicotine lozenge. Provided handouts regarding  [place]Quitline information and NRT agents in case patient changes her mind. Patient and her husband expressed appreciation. Plan for patient to follow up as needed in the future if she would likely further guidance from pharmacy team regarding smoking cessation.  2. Financial Assistance (prior authorization) Patient has [employer]. Although Trelegy Ellipta is a non-preferred agent, a prior authorization can be submitted in order for the medication to be covered. Frederik Schmidt, certified pharmacy technician, has submitted a prior authorization for Trelegy Ellipta. Prior authorization was approved for 07/28/19 to 07/27/20. [place] and confirmed copay would be $3 each for Trelegy Ellipta and albuterol inhalers. Followed up and notified patient of the cost. Patient expressed appreciation.  Thank you for involving pharmacy to assist in providing Ms.Schrager's care.              Drexel Iha, PharmD PGY2 Ambulatory Care Pharmacy Resident

## 2019-07-27 NOTE — Progress Notes (Signed)
$'@Patient'P$  ID: Vanessa Haas, female    DOB: [birth year], 58 y.o.   MRN: [id number]  Chief Complaint  Patient presents with  . Follow-up    F/U after pharmacy visit    Referring provider: Trey Sailors, PA  HPI:  58 year old female former smoker followed in our office for COPD and chronic respiratory failure.  Initially referred to our office on 01/16/2019 due to increased shortness of breath and cough since 2012 has progressively worsened.  PMH: Anxiety, IBS Smoker/ Smoking History: Former smoker.  Quit December/2020. Maintenance: Trelegy Ellipta   Pt of: Dr. Melvyn Novas  07/28/2019  - Visit   58 year old female former smoker followed in our office for COPD and chronic respiratory failure.  Patient presents today after seeing the clinical pharmacy team for help with inhaler access.  Clinical pharmacy team is working with the patient regarding access to her Trelegy Ellipta.  She reported to them today that she stopped smoking cold [place] 1 week ago.  She was not interested in additional smoking cessation help.  Patient also reported that she stopped all of her medications in September/2020 out of concerns for IBS.  She reports that she is back on some of her medications.  She does take Seroquel 150 mg at night to help with her sleep.  She also takes clonazepam twice daily for management of her anxiety.  She reports regular follow-up with psychiatry.  She does not have a therapist.  She still has concerns for IBS but she does not have regular follow-up with gastroenterology.  She takes promethazine cough syrup to help with management of antibiotics due to the fact that she has IBS.  She reports that she has not followed up with gastroenterology.  Patient does wear oxygen 2 L with exertion.  Her last walk test in our office was in June/2020 where she did not require oxygen.  They are also concerned the patient may need oxygen at night.  Patient continues to follow-up with primary care regarding  her breathing.  They treated her twice recently for suspected COPD exacerbations.  No chest x-ray or imaging has been obtained.  See information listed below:  07/01/2019 -zpak / pred  07/23/2019 - doxy / pred   Patient is still finishing the doxycycline and the prednisone today.  She reports she has 2 more days of it.  She continues to not be maintained on an inhaler.  She reports she has not taken Trelegy Ellipta since June/2020.  She reports that she cannot afford it.  She is working with our clinical pharmacy team who she met with today for a prior authorization.  She did feel like she benefited from [place] when she was taking it.  He is currently using her rescue inhaler 4 times daily.  Questionaires / Pulmonary Flowsheets:   MMRC: mMRC Dyspnea Scale mMRC Score  07/28/2019 3  07/28/2019 1    Tests:   02/14/2018-CBC with differential-eosinophils relative 5, eosinophils absolute 0.5  11/08/2011-negative for alpha-1 antitrypsin deficiency  12/06/2011-negative, cystic fibrosis mutations not detected 10/25/2011-IgG-1510, IgA-329, IgM-206, normal next 10/25/2011-ACE level-14  10/30/2011-bronchoscopy, left lower lobe lung biopsy [facility] interpretation: Study is considered normal and does not support a clinical diagnosis of primary ciliary dyskinesia 10/30/2011-AFB-BAL washings-no acid-fast bacilli seen  10/25/2011-hypersensitivity pneumonitis-not detected 10/25/2011-allergy profile-elevations with dust mites, fescue, rye, [place] grass, red top, [place] grass, Alternaria Alternaria, ST EM PHY Ally UM BOT RYOS UM, IgE 24, largest elevations with dust mites fescue grass rye and red top  09/27/2011-respiratory sputum-normal growth of oropharyngeal flora 10/06/2015-chest x-ray-hyperinflation consistent with known reactive airways disease  10/26/2011-CT chest without contrast-throughout basal segments of left lower lobe is extensive bronchial wall thickening and mild cylindrical  bronchiectasis with extensive mucus plugging with peribronchial vascular micro nodularity and groundglass attenuation,  10/17/2011-CT maxillofacial-probable small mucous retention cyst in the floor the left maxillary sinuses, otherwise paranasal sinuses clear  08/26/2010-spirometry-FVC 5.5 L (88% predicted), ratio 71, FEV1 4 L (79% predicted)   FENO:  No results found for: NITRICOXIDE  PFT: No flowsheet data found.  WALK:  SIX MIN WALK 07/28/2019 01/16/2019  Supplimental Oxygen during Test? (L/min) No No  Tech Comments: Patient was able to complete 1 lap. She was able to walk at a steady pace. She wanted to stop after the 1st lap due to her feeling hot and having SOB. Denied any chest pain, leg pain. average pace/SOB//lmr    Imaging: DG Chest 2 View  Result Date: 07/28/2019 CLINICAL DATA:  Shortness of breath, cough EXAM: CHEST - 2 VIEW COMPARISON:  None. FINDINGS: No new consolidation or edema. No pleural effusion or pneumothorax. Normal heart size. Recent and chronic left humeral fractures and chronic left rib fractures. IMPRESSION: No acute process in the chest. Electronically Signed   By: Macy Mis M.D.   On: 07/28/2019 11:00    Lab Results:  CBC    Component Value Date/Time   WBC 9.6 02/14/2018 1621   RBC 4.58 02/14/2018 1621   HGB 14.6 02/14/2018 1621   HCT 42.6 02/14/2018 1621   PLT 204 02/14/2018 1621   MCV 93.0 02/14/2018 1621   MCH 31.9 02/14/2018 1621   MCHC 34.3 02/14/2018 1621   RDW 12.6 02/14/2018 1621   LYMPHSABS 2.1 02/14/2018 1621   MONOABS 0.6 02/14/2018 1621   EOSABS 0.5 02/14/2018 1621   BASOSABS 0.2 (H) 02/14/2018 1621    BMET    Component Value Date/Time   NA 139 02/14/2018 1621   K 4.2 02/14/2018 1621   CL 108 02/14/2018 1621   CO2 22 02/14/2018 1621   GLUCOSE 91 02/14/2018 1621   BUN 9 02/14/2018 1621   CREATININE 0.98 02/14/2018 1621   CALCIUM 8.8 (L) 02/14/2018 1621   GFRNONAA >60 02/14/2018 1621   GFRAA >60 02/14/2018 1621     BNP No results found for: BNP  ProBNP No results found for: PROBNP  Specialty Problems      Pulmonary Problems   COPD GOLD ?  / group D     Former smoker, quit December/2020 - 01/16/2019  After extensive coaching inhaler device,  effectiveness =    90% with dpi, 50% with hfa       Chronic respiratory failure with hypoxia (HCC)      Allergies  Allergen Reactions  . Codeine Itching and Rash  . Levofloxacin Nausea And Vomiting    Patient states that it is too strong, will take if she has too. Can hurt kidneys   . Quetiapine Other (See Comments)    Medication is too strong for patient. It knocks her out.  . Tape Rash    Blisters skin - paper tape is ok    Immunization History  Administered Date(s) Administered  . Influenza Split 08/19/2010  . Influenza,inj,Quad PF,6+ Mos 09/08/2016, 05/07/2019  . Influenza-Unspecified 05/27/2015  . Pneumococcal Polysaccharide-23 04/19/2010, 09/08/2016    Past Medical History:  Diagnosis Date  . Anxiety   . Bipolar 1 disorder, depressed ([place])   . CAD (coronary artery disease)   . Cataract   .  COPD (chronic obstructive pulmonary disease) ([place])   . Depression   . Headache    chronic  . Hepatitis C    Type II  . Hyperlipidemia   . Hypertension   . IBS (irritable bowel syndrome)   . Migraine   . Neuropathy   . Seasonal allergic rhinitis   . Seizures (HCC)    hx of partial  . Substance abuse ([place])    drug free x4 mos (cocaine)    Tobacco History: Social History   Tobacco Use  Smoking Status Former Smoker  . Packs/day: 1.00  . Years: 44.00  . Pack years: 44.00  . Types: Cigarettes  . Start date: 08/14/1974  . Quit date: 06/26/2019  . Years since quitting: 0.0  Smokeless Tobacco Never Used   Counseling given: Yes  Smoking assessment and cessation counseling  Patient currently smoking: 0 cigarettes, recently quit last week I have advised the patient to quit/stop smoking as soon as possible due to high risk for  multiple medical problems.  It will also be very difficult for [place] to manage patient's  respiratory symptoms and status if we continue to expose her lungs to a known irritant.  We do not advise e-cigarettes as a form of stopping smoking.  Patient is willing to quit smoking.  I have advised the patient that we can assist and have options of nicotine replacement therapy, provided smoking cessation education today, provided smoking cessation counseling, and provided cessation resources.  Patient was offered additional smoking cessation resources today by the clinical pharmacy team.  Patient refused this.  Encourage patient today to continue to not smoke.  Follow-up next office visit office visit for assessment of smoking cessation.    Smoking cessation counseling advised for: 17mn    Outpatient Encounter Medications as of 07/28/2019  Medication Sig  . albuterol (PROVENTIL) (2.5 MG/3ML) 0.083% nebulizer solution use 3 mLs (2.5 mg total) by nebulization every 8 (eight) hours as needed for up to 30 days for Wheezing.  .Marland Kitchenalbuterol (VENTOLIN HFA) 108 (90 Base) MCG/ACT inhaler Inhale 2 puffs into the lungs every 6 (six) hours as needed for wheezing or shortness of breath.  . clonazePAM (KLONOPIN) 1 MG tablet Take 1 mg by mouth 2 (two) times daily.   .Marland KitchenDextromethorphan-guaiFENesin (MUCINEX DM PO) Take by mouth 2 (two) times daily.  . Fluticasone-Umeclidin-Vilant (TRELEGY ELLIPTA) 100-62.5-25 MCG/INH AEPB Inhale 1 puff into the lungs daily for 28 days.  . Promethazine-DM 6.25-15 MG/5ML SOLN Take 5 mLs by mouth every 6 (six) hours as needed (cough).   . QUEtiapine (SEROQUEL) 50 MG tablet Take 50 mg by mouth at bedtime.  . topiramate (TOPAMAX) 100 MG tablet Take 200 mg by mouth at bedtime.   . Fluticasone-Umeclidin-Vilant (TRELEGY ELLIPTA) 100-62.5-25 MCG/INH AEPB Inhale 1 puff into the lungs daily.  . [DISCONTINUED] albuterol (PROAIR HFA) 108 (90 Base) MCG/ACT inhaler 2 puffs every 4 hours as needed  only  if your can't catch your breath  . [DISCONTINUED] ARIPiprazole (ABILIFY) 10 MG tablet Take 10 mg by mouth daily.  . [DISCONTINUED] buPROPion (WELLBUTRIN) 75 MG tablet Take 75 mg by mouth daily.  . [DISCONTINUED] carvedilol (COREG) 3.125 MG tablet Take 3.125 mg by mouth 2 (two) times daily with a meal.  . [DISCONTINUED] citalopram (CELEXA) 40 MG tablet Take 40 mg by mouth daily.  . [DISCONTINUED] dicyclomine (BENTYL) 20 MG tablet Take 20 mg by mouth 2 (two) times daily.  . [DISCONTINUED] Fluticasone-Umeclidin-Vilant (TRELEGY ELLIPTA) 100-62.5-25 MCG/INH AEPB Inhale 1 Inhaler into  the lungs every morning.  . [DISCONTINUED] isosorbide mononitrate (IMDUR) 120 MG 24 hr tablet Take 120 mg by mouth daily.  . [DISCONTINUED] meloxicam (MOBIC) 7.5 MG tablet Take 7.5 mg by mouth daily.  . [DISCONTINUED] montelukast (SINGULAIR) 10 MG tablet Take 10 mg by mouth daily.  . [DISCONTINUED] oxyCODONE-acetaminophen (PERCOCET/ROXICET) 5-325 MG tablet Take 1 tablet by mouth every 6 (six) hours as needed for severe pain. (Patient not taking: Reported on 01/16/2019)  . [DISCONTINUED] pantoprazole (PROTONIX) 20 MG tablet Take 1 tablet (20 mg total) by mouth 2 (two) times daily before a meal. (Patient not taking: Reported on 01/16/2019)  . [DISCONTINUED] prazosin (MINIPRESS) 5 MG capsule Take 5 mg by mouth daily.  . [DISCONTINUED] pregabalin (LYRICA) 300 MG capsule Take 300 mg by mouth 2 (two) times daily.  . [DISCONTINUED] SUMAtriptan (IMITREX) 100 MG tablet Take 100 mg by mouth every 2 (two) hours as needed for migraine. May repeat in 2 hours if headache persists or recurs.   No facility-administered encounter medications on file as of 07/28/2019.    Review of Systems  Review of Systems  Constitutional: Positive for fatigue. Negative for activity change and fever.  HENT: Positive for congestion. Negative for sinus pressure, sinus pain and sore throat.   Respiratory: Positive for shortness of breath. Negative for  cough and wheezing.   Cardiovascular: Negative for chest pain and palpitations.  Gastrointestinal: Negative for diarrhea, nausea and vomiting.  Musculoskeletal: Negative for arthralgias.  Neurological: Negative for dizziness.  Psychiatric/Behavioral: Negative for sleep disturbance. The patient is not nervous/anxious.      Physical Exam  BP 124/76   Pulse 85   Temp (!) 97.3 F (36.3 C) (Temporal)   Ht '5\' 3"'$  (1.6 m)   Wt 154 lb 3.2 oz (69.9 kg)   SpO2 99%   BMI 27.32 kg/m   Wt Readings from Last 5 Encounters:  07/28/19 154 lb 3.2 oz (69.9 kg)  01/16/19 163 lb (73.9 kg)  02/14/18 160 lb (72.6 kg)  07/12/11 145 lb 8 oz (66 kg)    BMI Readings from Last 5 Encounters:  07/28/19 27.32 kg/m  01/16/19 28.87 kg/m  02/14/18 28.34 kg/m  07/12/11 25.77 kg/m     Physical Exam Vitals and nursing note reviewed.  Constitutional:      General: She is in acute distress.     Appearance: Normal appearance. She is obese. She is ill-appearing.  HENT:     Head: Normocephalic and atraumatic.     Right Ear: Tympanic membrane, ear canal and external ear normal. There is no impacted cerumen.     Left Ear: Tympanic membrane, ear canal and external ear normal. There is no impacted cerumen.     Nose: Nose normal. No congestion.     Mouth/Throat:     Mouth: Mucous membranes are moist.     Pharynx: Oropharynx is clear.  Eyes:     Pupils: Pupils are equal, round, and reactive to light.  Cardiovascular:     Rate and Rhythm: Normal rate and regular rhythm.     Pulses: Normal pulses.     Heart sounds: Normal heart sounds. No murmur.  Pulmonary:     Effort: Pulmonary effort is normal. No respiratory distress.     Breath sounds: Normal breath sounds. No decreased air movement. No decreased breath sounds, wheezing or rales.  Musculoskeletal:     Cervical back: Normal range of motion.  Skin:    General: Skin is warm and dry.     Capillary  Refill: Capillary refill takes less than 2 seconds.   Neurological:     General: No focal deficit present.     Mental Status: She is alert and oriented to person, place, and time. Mental status is at baseline.     Gait: Gait normal.  Psychiatric:        Attention and Perception: Attention normal.        Mood and Affect: Mood is anxious. Affect is flat.        Speech: Speech normal.        Behavior: Behavior normal. Behavior is cooperative.        Thought Content: Thought content normal.        Cognition and Memory: Cognition normal.        Judgment: Judgment normal.       Assessment & Plan:   COPD GOLD ?  / group D  Plan: Restart Trelegy Ellipta, samples provided today Walk today in office Continue to not smoke Okay to finish doxycycline and prednisone as outlined by primary care Notify our office if you are having issues with your breathing Chest x-ray today Pulmonary function test ordered Close follow-up in our office in 2 to 4 weeks with Dr. Melvyn Novas with all medications and hand Continue to follow-up with clinical pharmacy team here at our office regarding your Trelegy Ellipta  IBS (irritable bowel syndrome) Plan: Patient to follow-up with known gastroenterologist as well as primary care  Chronic respiratory failure with hypoxia ([place]) Patient maintain oxygen saturations at 91% with walk today, only able to complete 1 lap  Plan: We will obtain overnight oximetry to further evaluate oxygen needs at night Unfortunately unable to establish patient with new DME company today as her walk test did not justify exertional oxygen  Anxiety Plan: Patient needs to follow-up with primary care Referral to psychology today Patient provided list of psychiatric/therapy resources in the [place] area Encourage patient to have both a therapist as well as a psychiatrist  Former smoker Plan: Emphasized need for the patient to continue to not smoke We will order pulmonary function test Referral to lung cancer screening program  today  Medication management Plan: Patient to continue to work with our clinical pharmacy team here regarding her prior authorization for Trelegy Ellipta with Medicaid Samples of Trelegy Ellipta provided today Close follow-up in our office in 2 to 4 weeks with Dr. Melvyn Novas with all medications and hand    Return in about 2 weeks (around 08/11/2019), or if symptoms worsen or fail to improve, for Follow up with Dr. Melvyn Novas.   Lauraine Rinne, NP 07/28/2019   This appointment was 45 minutes long with over 50% of the time in direct face-to-face patient care, assessment, plan of care, and follow-up.

## 2019-07-28 ENCOUNTER — Ambulatory Visit: Payer: Medicaid Other | Admitting: Pulmonary Disease

## 2019-07-28 ENCOUNTER — Telehealth: Payer: Self-pay | Admitting: Pharmacy Technician

## 2019-07-28 ENCOUNTER — Ambulatory Visit (INDEPENDENT_AMBULATORY_CARE_PROVIDER_SITE_OTHER): Payer: Medicaid Other | Admitting: Pharmacist

## 2019-07-28 ENCOUNTER — Encounter: Payer: Self-pay | Admitting: Pulmonary Disease

## 2019-07-28 ENCOUNTER — Ambulatory Visit (INDEPENDENT_AMBULATORY_CARE_PROVIDER_SITE_OTHER): Payer: Medicaid Other

## 2019-07-28 ENCOUNTER — Other Ambulatory Visit: Payer: Self-pay

## 2019-07-28 VITALS — BP 124/76 | HR 85 | Temp 97.3°F | Ht 63.0 in | Wt 154.2 lb

## 2019-07-28 DIAGNOSIS — Z87891 Personal history of nicotine dependence: Secondary | ICD-10-CM | POA: Diagnosis not present

## 2019-07-28 DIAGNOSIS — J449 Chronic obstructive pulmonary disease, unspecified: Secondary | ICD-10-CM

## 2019-07-28 DIAGNOSIS — J9611 Chronic respiratory failure with hypoxia: Secondary | ICD-10-CM | POA: Diagnosis not present

## 2019-07-28 DIAGNOSIS — Z79899 Other long term (current) drug therapy: Secondary | ICD-10-CM

## 2019-07-28 DIAGNOSIS — K589 Irritable bowel syndrome without diarrhea: Secondary | ICD-10-CM

## 2019-07-28 DIAGNOSIS — F419 Anxiety disorder, unspecified: Secondary | ICD-10-CM

## 2019-07-28 MED ORDER — ALBUTEROL SULFATE HFA 108 (90 BASE) MCG/ACT IN AERS: 2.0000 | INHALATION_SPRAY | Freq: Four times a day (QID) | RESPIRATORY_TRACT | 11 refills | Status: DC | PRN

## 2019-07-28 MED ORDER — TRELEGY ELLIPTA 100-62.5-25 MCG/INH IN AEPB: 1.0000 | INHALATION_SPRAY | Freq: Every day | RESPIRATORY_TRACT | 0 refills | Status: DC

## 2019-07-28 MED ORDER — TRELEGY ELLIPTA 100-62.5-25 MCG/INH IN AEPB: 1.0000 | INHALATION_SPRAY | Freq: Every day | RESPIRATORY_TRACT | 11 refills | Status: DC

## 2019-07-28 MED ORDER — TRELEGY ELLIPTA 100-62.5-25 MCG/INH IN AEPB: 1.0000 | INHALATION_SPRAY | Freq: Every day | RESPIRATORY_TRACT | 11 refills | Status: AC

## 2019-07-28 NOTE — Assessment & Plan Note (Signed)
Plan: Patient to follow-up with known gastroenterologist as well as primary care

## 2019-07-28 NOTE — Assessment & Plan Note (Signed)
Plan: Patient needs to follow-up with primary care Referral to psychology today Patient provided list of psychiatric/therapy resources in the [place] area Encourage patient to have both a therapist as well as a psychiatrist

## 2019-07-28 NOTE — Progress Notes (Signed)
   Subjective Patient presents to [facility] and seen by the pharmacist for smoking cessation counseling.   Patient was referred for financial assistance/smoking cessation Dr. Wert on 06/12/2019. Rachael Perry, certified pharmacy technician, has submitted a prior authorization for Trelegy Ellipta. Prior authorization was approved for 07/28/19 to 07/27/20.  Pt presents for initial appt with pharmacy team with her husband (permission given). Patient states she has stopped taking most of her medications in Sept 2020 due to stomach upset. She states she has not used Trelegy Ellipta since July 2020. She states she is using Mucinex DM "religiously". She is using her rescue inhaler 4x per day and her nebulizer 3-4x per day. She states she quit smoking last Monday "cold turkey", however, still feels horrible. She is not interested in discussing smoking cessation at this time. She is not interested in using NRT, Chantix, or bupropion to quit smoking. She would prefer to continue quitting "cold turkey". Patient states she is interested in pursuing pulmonary rehab in the future, but would like to wait until COVID-19 pandemic has resolved before starting.  Patient states she knows how to use her Trelegy Ellipta inhaler and would not like to be counseled on its use. She confirms she rinses her mouth out after using. She states she has used the In-Check Dial in the past and is not interested in using at this time to see if she is able to take an appropriate breath from Ellipta inhaler. She states she knows the Trelegy Ellipta is effective for helping her breathing because she used it in the past and "it worked".  Assessment and Plan  1. Smoking Cessation Patient quit smoking on 07/21/2019 via "cold turkey" method. She is not interested in discussing smoking cessation management. She is not interested inusing Chantix, bupropion, nicotine patch, nicotine gum, and/or nicotine lozenge. Provided handouts regarding  [place]uitline information and NRT agents in case patient changes her mind. Patient and her husband expressed appreciation. Plan for patient to follow up as needed in the future if she would likely further guidance from pharmacy team regarding smoking cessation.  2. Financial Assistance (prior authorization) Patient has Medicaid insurance. Although Trelegy Ellipta is a non-preferred agent, a prior authorization can be submitted in order for the medication to be covered. Rachael Perry, certified pharmacy technician, has submitted a prior authorization for Trelegy Ellipta. Prior authorization was approved for 07/28/19 to 07/27/20. Called [facility] pharmacy and confirmed copay would be $3 each for Trelegy Ellipta and albuterol inhalers. Followed up and notified patient of the cost. Patient expressed appreciation.  Thank you for involving pharmacy to assist in providing Ms.Reels's care.              Taleeyah Bora, PharmD PGY2 Ambulatory Care Pharmacy Resident  

## 2019-07-28 NOTE — Assessment & Plan Note (Signed)
Patient maintain oxygen saturations at 91% with walk today, only able to complete 1 lap  Plan: We will obtain overnight oximetry to further evaluate oxygen needs at night Unfortunately unable to establish patient with new DME company today as her walk test did not justify exertional oxygen

## 2019-07-28 NOTE — Assessment & Plan Note (Signed)
Plan: Patient to continue to work with our clinical pharmacy team here regarding her prior authorization for Trelegy Ellipta with Medicaid Samples of Trelegy Ellipta provided today Close follow-up in our office in 2 to 4 weeks with Dr. Melvyn Novas with all medications and hand

## 2019-07-28 NOTE — Assessment & Plan Note (Addendum)
Plan: Restart Trelegy Ellipta, samples provided today Walk today in office Continue to not smoke Okay to finish doxycycline and prednisone as outlined by primary care Sputum cultures today  Notify our office if you are having issues with your breathing Chest x-ray today Pulmonary function test ordered Close follow-up in our office in 2 to 4 weeks with Dr. Melvyn Novas with all medications and hand Continue to follow-up with clinical pharmacy team here at our office regarding your Trelegy Ellipta

## 2019-07-28 NOTE — Patient Instructions (Addendum)
You were seen today by Lauraine Rinne, NP  for:   1. 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  Dispense: 2 each; Refill: 0  Trelegy Ellipta  >>> 1 puff daily in the morning >>>rinse mouth out after use  >>> This inhaler contains 3 medications that help manage her respiratory status, contact our office if you cannot afford this medication or unable to remain on this medication  Only use your albuterol as a rescue medication to be used if you can't catch your breath by resting or doing a relaxed purse lip breathing pattern.  - The less you use it, the better it will work when you need it. - Ok to use up to 2 puffs  every 4 hours if you must but call for immediate appointment if use goes up over your usual need - Don't leave home without it !!  (think of it like the spare tire for your car)   Note your daily symptoms > remember "red flags" for COPD:   >>>Increase in cough >>>increase in sputum production >>>increase in shortness of breath or activity  intolerance.   If you notice these symptoms, please call the office to be seen.   At next appointment please bring all of your medications and hand including your inhalers  2. Chronic respiratory failure with hypoxia (HCC)  - Pulse oximetry, overnight; Future  Continue oxygen therapy as prescribed  >>>maintain oxygen saturations greater than 88 percent  >>>if unable to maintain oxygen saturations please contact the office  >>>do not smoke with oxygen  >>>can use nasal saline gel or nasal saline rinses to moisturize nose if oxygen causes dryness   3. Former smoker  - Pulmonary function test; Future  Continue to not smoke  4. Medication management  We will provide samples today of Trelegy Ellipta Notify our office if you are running low or you still are unable to afford  this Continue to follow-up with clinical pharmacy team here regarding the prior authorization you need for Medicaid to start paying for your Trelegy Ellipta  5. Irritable bowel syndrome, unspecified type  As discussed today please schedule a follow-up appointment with your gastroenterologist  6. Anxiety  - Ambulatory referral to Psychology  I believe you would benefit from follow-up with therapy as well as your psychiatrist I recommend that you schedule an appointment for therapy I have referred you to psychology today  Primary care can also continue to work with you on this  We recommend today:  Orders Placed This Encounter  Procedures  . AFB Culture & Smear    Standing Status:   Future    Standing Expiration Date:   07/27/2020  . Respiratory or Resp and Sputum Culture    Standing Status:   Future    Standing Expiration Date:   07/27/2020  . Fungus Culture & Smear    Standing Status:   Future    Standing Expiration Date:   07/27/2020  . DG Chest 2 View    Standing Status:   Future    Number of Occurrences:   1    Standing Expiration Date:   09/27/2020    Order Specific Question:   Reason for Exam (SYMPTOM  OR DIAGNOSIS REQUIRED)    Answer:   shortness of breath / cough    Order Specific Question:  Preferred imaging location?    Answer:   Internal    Order Specific Question:   Radiology Contrast Protocol - do NOT remove file path    Answer:   [email or web address]  . Ambulatory referral to Psychology    Referral Priority:   Urgent    Referral Type:   Psychiatric    Referral Reason:   Specialty Services Required    Requested Specialty:   Psychology    Number of Visits Requested:   1  . Pulse oximetry, overnight    Standing Status:   Future    Standing Expiration Date:   07/27/2020    Scheduling Instructions:     On RA  . Pulmonary function test    Standing Status:   Future    Standing Expiration Date:   07/27/2020    Order  Specific Question:   Where should this test be performed?    Answer:   [facility]    Order Specific Question:   Full PFT: includes the following: basic spirometry, spirometry pre & post bronchodilator, diffusion capacity (DLCO), lung volumes    Answer:   Full PFT   Orders Placed This Encounter  Procedures  . AFB Culture & Smear  . Respiratory or Resp and Sputum Culture  . Fungus Culture & Smear  . DG Chest 2 View  . Ambulatory referral to Psychology  . Pulse oximetry, overnight  . Pulmonary function test   Meds ordered this encounter  Medications  . Fluticasone-Umeclidin-Vilant (TRELEGY ELLIPTA) 100-62.5-25 MCG/INH AEPB    Sig: Inhale 1 puff into the lungs daily.    Dispense:  2 each    Refill:  0    Order Specific Question:   Lot Number?    Answer:   [id number]    Order Specific Question:   Expiration Date?    Answer:   12/11/2020    Order Specific Question:   Manufacturer?    Answer:   [employer] [[id number]]    Follow Up:    Return in about 2 weeks (around 08/11/2019), or if symptoms worsen or fail to improve, for Follow up with Dr. Sherene SiresWert.   Please do your part to reduce the spread of COVID-19:      Reduce your risk of any infection  and COVID19 by using the similar precautions used for avoiding the common cold or flu:  Marland Kitchen. Wash your hands often with soap and warm water for at least 20 seconds.  If soap and water are not readily available, use an alcohol-based hand sanitizer with at least 60% alcohol.  . If coughing or sneezing, cover your mouth and nose by coughing or sneezing into the elbow areas of your shirt or coat, into a tissue or into your sleeve (not your hands). Drinda Butts. WEAR A MASK when in public  . Avoid shaking hands with others and consider head nods or verbal greetings only. . Avoid touching your eyes, nose, or mouth with unwashed hands.  . Avoid close contact with people who are sick. . Avoid places or events with large numbers of people in one location, like  concerts or sporting events. . If you have some symptoms but not all symptoms, continue to monitor at home and seek medical attention if your symptoms worsen. . If you are having a medical emergency, call 911.   ADDITIONAL HEALTHCARE OPTIONS FOR PATIENTS  [facility] / e-Visit: [email or web address]         [facility]: [phone]  [facility]:  [phone]                   [facility]: [phone]     It is flu season:   >>> Best ways to protect herself from the flu: Receive the yearly flu vaccine, practice good hand hygiene washing with soap and also using hand sanitizer when available, eat a nutritious meals, get adequate rest, hydrate appropriately   Please contact the office if your symptoms worsen or you have concerns that you are not improving.   Thank you for choosing [facility] for your healthcare, and for allowing [place] to partner with you on your healthcare journey. I am thankful to be able to provide care to you today.   Elisha Headland FNP-C    Chronic Obstructive Pulmonary Disease Chronic obstructive pulmonary disease (COPD) is a long-term (chronic) lung problem. When you have COPD, it is hard for air to get in and out of your lungs. Usually the condition gets worse over time, and your lungs will never return to normal. There are things you can do to keep yourself as healthy as possible.  Your doctor may treat your condition with: ? Medicines. ? Oxygen. ? Lung surgery.  Your doctor may also recommend: ? Rehabilitation. This includes steps to make your body work better. It may involve a team of specialists. ? Quitting smoking, if you smoke. ? Exercise and changes to your diet. ? Comfort measures (palliative care). Follow these instructions at home: Medicines  Take over-the-counter and prescription medicines only as told by your doctor.  Talk to your doctor  before taking any cough or allergy medicines. You may need to avoid medicines that cause your lungs to be dry. Lifestyle  If you smoke, stop. Smoking makes the problem worse. If you need help quitting, ask your doctor.  Avoid being around things that make your breathing worse. This may include smoke, chemicals, and fumes.  Stay active, but remember to rest as well.  Learn and use tips on how to relax.  Make sure you get enough sleep. Most adults need at least 7 hours of sleep every night.  Eat healthy foods. Eat smaller meals more often. Rest before meals. Controlled breathing Learn and use tips on how to control your breathing as told by your doctor. Try:  Breathing in (inhaling) through your nose for 1 second. Then, pucker your lips and breath out (exhale) through your lips for 2 seconds.  Putting one hand on your belly (abdomen). Breathe in slowly through your nose for 1 second. Your hand on your belly should move out. Pucker your lips and breathe out slowly through your lips. Your hand on your belly should move in as you breathe out.  Controlled coughing Learn and use controlled coughing to clear mucus from your lungs. Follow these steps: 1. Lean your head a little forward. 2. Breathe in deeply. 3. Try to hold your breath for 3 seconds. 4. Keep your mouth slightly open while coughing 2 times. 5. Spit any mucus out into a tissue. 6. Rest and do the steps again 1 or 2 times as needed. General instructions  Make sure you get all the shots (vaccines) that your doctor recommends. Ask your doctor about a flu shot and a pneumonia shot.  Use oxygen therapy and pulmonary rehabilitation if told by your doctor. If you need home oxygen therapy, ask your doctor if you should buy a tool to measure your oxygen level (oximeter).  Make a COPD action  plan with your doctor. This helps you to know what to do if you feel worse than usual.  Manage any other conditions you have as told by your  doctor.  Avoid going outside when it is very hot, cold, or humid.  Avoid people who have a sickness you can catch (contagious).  Keep all follow-up visits as told by your doctor. This is important. Contact a doctor if:  You cough up more mucus than usual.  There is a change in the color or thickness of the mucus.  It is harder to breathe than usual.  Your breathing is faster than usual.  You have trouble sleeping.  You need to use your medicines more often than usual.  You have trouble doing your normal activities such as getting dressed or walking around the house. Get help right away if:  You have shortness of breath while resting.  You have shortness of breath that stops you from: ? Being able to talk. ? Doing normal activities.  Your chest hurts for longer than 5 minutes.  Your skin color is more blue than usual.  Your pulse oximeter shows that you have low oxygen for longer than 5 minutes.  You have a fever.  You feel too tired to breathe normally. Summary  Chronic obstructive pulmonary disease (COPD) is a long-term lung problem.  The way your lungs work will never return to normal. Usually the condition gets worse over time. There are things you can do to keep yourself as healthy as possible.  Take over-the-counter and prescription medicines only as told by your doctor.  If you smoke, stop. Smoking makes the problem worse. This information is not intended to replace advice given to you by your health care provider. Make sure you discuss any questions you have with your health care provider. Document Released: 01/17/2008 Document Revised: 07/13/2017 Document Reviewed: 09/04/2016 Elsevier Patient Education  2020 [employer].

## 2019-07-28 NOTE — Patient Instructions (Addendum)
It was a pleasure seeing you in clinic today Vanessa Haas!  Today the plan is... 1. Dayna Alia (pharmacist) will contact you about price of Trelegy Ellipta and albuterol inhaler   Please call the PharmD clinic at [phone] if you have any questions that you would like to speak with a pharmacist about Stanton Kidney, Museum/gallery conservator).

## 2019-07-28 NOTE — Telephone Encounter (Signed)
Received notification from [facility] Medicaid regarding a Non-formulary prior authorization for Trelegy Ellipta 100. Authorization has been APPROVED from 07/28/19 to 07/27/20.   Will send document to scan center.  Authorization # [id number]  Claim process for $3.00 copay.  12:07 PM Beatriz Chancellor, CPhT

## 2019-07-28 NOTE — Assessment & Plan Note (Signed)
Plan: Emphasized need for the patient to continue to not smoke We will order pulmonary function test Referral to lung cancer screening program today

## 2019-08-18 ENCOUNTER — Encounter: Payer: Self-pay | Admitting: Internal Medicine

## 2019-08-18 ENCOUNTER — Other Ambulatory Visit: Payer: Self-pay

## 2019-08-18 ENCOUNTER — Ambulatory Visit (INDEPENDENT_AMBULATORY_CARE_PROVIDER_SITE_OTHER): Payer: Medicaid Other | Admitting: Internal Medicine

## 2019-08-18 DIAGNOSIS — J449 Chronic obstructive pulmonary disease, unspecified: Secondary | ICD-10-CM | POA: Diagnosis not present

## 2019-08-18 DIAGNOSIS — J9611 Chronic respiratory failure with hypoxia: Secondary | ICD-10-CM

## 2019-08-18 MED ORDER — PREDNISONE 10 MG PO TABS: ORAL_TABLET | ORAL | 0 refills | Status: DC

## 2019-08-18 MED ORDER — ALBUTEROL SULFATE (2.5 MG/3ML) 0.083% IN NEBU: INHALATION_SOLUTION | RESPIRATORY_TRACT | 11 refills | Status: DC

## 2019-08-18 MED ORDER — ALBUTEROL SULFATE HFA 108 (90 BASE) MCG/ACT IN AERS: 2.0000 | INHALATION_SPRAY | Freq: Four times a day (QID) | RESPIRATORY_TRACT | 11 refills | Status: DC | PRN

## 2019-08-18 NOTE — Assessment & Plan Note (Addendum)
ONO ordered  Reports desats only when walking but not following previous recs to titrate daytime 02   >>  Rec ake sure you check your oxygen saturations at highest level of activity to be sure it stays over 90% and adjust upward to maintain this level if needed but remember to turn it back to previous settings when you stop (to conserve your supply).    >>> proceed with ONO RA    Each maintenance medication was reviewed in detail including most importantly the difference between maintenance and as needed and under what circumstances the prns are to be used.  Please see AVS for specific  Instructions which are unique to this visit and I personally typed out  which were reviewed in detail over the phone with the patient and a copy provided via regular mail

## 2019-08-18 NOTE — Progress Notes (Signed)
Vanessa Haas, female    DOB: 11/04/1960     MRN: [id number]   Brief patient profile:  35 yowf active smoker  With onset sob/cough around 2012 and progressively worse  on multiple (TNTC) inhalers/nebs since and noct 02 x 2018 so referred to pulmonary clinic 01/16/2019 by Vanessa Haas      History of Present Illness  01/16/2019  Pulmonary/ 1st office eval/Vanessa Haas  Chief Complaint  Patient presents with  . Consult    Consult ST:MHDQ. States her oxygen drops to 63. She usually wears 2L. He states if he keeps the house temp cooler it helps with her breathing. Smokes 4 cigarettes per day.   Dyspnea:  Mostly rides cart when shops / doe x across the room, better with 02  Cough: "some but it's allergies" worse in ams better p neb/ min mucoid prod Sleep: disrupted at least once - twice noct and needs saba  SABA [id number] noted   rec Plan A = Automatic = Trelegy take two good drags - hold with right hand and hinge open  Plan B = Backup Only use your albuterol inhaler as a rescue medication Plan C = Crisis - only use your albuterol-ipatropium (prefer pure albuterol)  nebulizer if you first try Plan B and it fails to help > ok to use the nebulizer up to every 4 hours but if start needing it regularly call for immediate appointment For cough > mucinex dm up to1200 mg every 12 hours as needed  For nasal drainage/ throat irritation "allergy"  > zyrtec 10 mg daily as needed      Virtual Visit via Telephone Note 02/19/2019   I connected with Vanessa Haas on 02/19/19 at 10:45 AM EDT by telephone and verified that I am speaking with the correct person using two identifiers.   I discussed the limitations, risks, security and privacy concerns of performing an evaluation and management service by telephone and the availability of in person appointments. I also discussed with the patient that there may be a patient responsible charge related to this service. The patient expressed understanding and  agreed to proceed.   History of Present Illness: copd gold ? / still smoking   Dyspnea:  Still room to room  Cough: only in afternoons Sleeping: variably am congestion/ no purulent sputum   SABA use: a couple of times a day 02: has portable and machine not using either Called in for pred prn from pcp  rec No change in medications for  Now The key is to stop smoking completely before smoking completely stops you! Please schedule a follow up office visit in 6 weeks, call sooner if needed with all medications /inhalers/ solutions in hand so we can verify exactly what you are taking. This includes all medications from all doctors and over the counters    Virtual Visit via Telephone Note 04/03/2019 History of Present Illness: copd ? Gold on trelegy/ still smoking Dyspnea:  Improved vs last visit Cough: better if stays inside/ no am  Sleeping: better  SABA use: maybe once a day hfa if stays inside, apparently still using duoneb twice daily for reasons that aren't clear  rec Plan A = Automatic = trelegy one click each am  Plan B = Backup Only use your albuterol inhaler as a rescue medication  Plan C = Crisis - only use your albuterol nebulizer (NOT duoneb=  combined with ipatropium)  if you first try Plan B and it fails to help > ok  to use the nebulizer up to every 4 hours but if start needing it regularly call for immediate appointment   Virtual Visit via Telephone Note 06/12/2019   I connected with Vanessa Haas on 06/12/19 at   815 AM EDT by telephone and verified that I am speaking with the correct person using two identifiers.   I discussed the limitations, risks, security and privacy concerns of performing an evaluation and management service by telephone and the availability of in person appointments. I also discussed with the patient that there may be a patient responsible charge related to this service. The patient expressed understanding and agreed to proceed.   History of  Present Illness: still smoking but less  Dyspnea:  Was better until ran out of trelegy around the 1st of Oct > aecopd presently on pred/amox Cough: more since stopped trelegy> clear mucus Sleeping: once or twice needs neb while flat, 2 pillows  SABA use: one twice daytime daily when active  02: 2 lpm hs, as needed during the day  Observations/Objective: 97% at rest per husband  Sounds fine on the phone at rest s rattling cough/ good phonatin  rec 02 should be 2lpm at bedtime and goal is to keep it above 90% during the day with activity and  at rest by adjusting your flow rate  The key is to stop smoking completely before smoking completely stops you! You can use albuterol up to every 4 hours as needed  - first try the inhaler and then if not relieved at rest, use the nebulizer next (wait 15 minutes after the inhalers first to see if it helped)   Virtual Visit via Telephone Note 08/18/2019   I connected with Vanessa Haas on 08/18/19 at  9:45 AM EST by telephone and verified that I am speaking with the correct person using two identifiers.   I discussed the limitations, risks, security and privacy concerns of performing an evaluation and management service by telephone and the availability of in person appointments. I also discussed with the patient that there may be a patient responsible charge related to this service. The patient expressed understanding and agreed to proceed.   History of Present Illness: copd / last smoked around Jul 15 2019 Dyspnea:  Room to room / worse since last pred rx 3 weeks prior  Cough: worse pnds/ assoc nasal congestion but no purulent secretions/ also improved with pred SABA use: bid neb / increased since off pred 02: ok at rest / 2lpm at hs, prn daytime to keep sats > 90%    No obvious day to day or daytime variability or assoc excess/ purulent sputum or mucus plugs or hemoptysis or cp or chest tightness, subjective wheeze or overt  hb symptoms.    Also  denies any obvious fluctuation of symptoms with weather or environmental changes or other aggravating or alleviating factors except as outlined above.   Meds reviewed/ med reconciliation completed       Observations/Objective: Talking in full sentences/ cough is is dry sounding, good voice texture    Assessment and Plan: See problem list for active a/p's   Follow Up Instructions: See avs for instructions unique to this ov which includes revised/ updated med list     I discussed the assessment and treatment plan with the patient. The patient was provided an opportunity to ask questions and all were answered. The patient agreed with the plan and demonstrated an understanding of the instructions.   The patient was  advised to call back or seek an in-person evaluation if the symptoms worsen or if the condition fails to improve as anticipated.  I provided 25 minutes of non-face-to-face time during this encounter.   [name], MD         Past Medical History:  Diagnosis Date  . Anxiety   . Bipolar 1 disorder, depressed ([place])   . CAD (coronary artery disease)   . Cataract   . COPD (chronic obstructive pulmonary disease) ([place])   . Depression   . Hepatitis C    Type II  . Hyperlipidemia   . Hypertension   . Migraine   . Neuropathy   . Seizures ([place])   . Substance abuse ([place])    drug free x4 mos (cocaine)

## 2019-08-18 NOTE — Patient Instructions (Addendum)
For drippy nose > zyrtec 10 mg daily as needed   For stuffy nose > zyrtec D one daily as needed > if not improving you will need to see ENT doctor- call for referral   Blow trelegy out thru nose to get the benefit of the prednisone in the inhaler  Prednisone 10 mg take  4 each am x 2 days,   2 each am x 2 days,  1 each am x 2 days and stop   Continue to adjust your daytime oyxgen to keep the saturations above 90%   Ok to reschedule your lung cancer screening CT for 3 months to avoid exposure to COVID 19   If not better by 2 weeks return with all your medications in hand - otherwise we can see you in 3 months

## 2019-08-18 NOTE — Assessment & Plan Note (Signed)
Former smoker, quit December/2020 - 01/16/2019  After extensive coaching inhaler device,  effectiveness =    90% with dpi, 50% with hfa   Mild flare in setting of "weather changes" without purulent secretions and no better on mucinex dm   rec Prednisone 10 mg take  4 each am x 2 days,   2 each am x 2 days,  1 each am x 2 days and stop  Zyrtec or zyrtec d for nasal symptoms see avs for instructions unique to this ov ent f/u if needed   If not improving return in 2 weeks with all meds in hand using a trust but verify approach to confirm accurate Medication  Reconciliation The principal here is that until we are certain that the  patients are doing what we've asked, it makes no sense to ask them to do more.

## 2019-08-25 ENCOUNTER — Ambulatory Visit: Payer: Medicaid Other

## 2019-08-26 LAB — RESPIRATORY CULTURE OR RESPIRATORY AND SPUTUM CULTURE
MICRO NUMBER:: 1194985
RESULT:: NORMAL
SPECIMEN QUALITY:: ADEQUATE

## 2019-08-26 LAB — FUNGUS CULTURE W SMEAR
MICRO NUMBER:: 1194984
SMEAR:: NONE SEEN
SPECIMEN QUALITY:: ADEQUATE

## 2019-09-03 ENCOUNTER — Ambulatory Visit: Payer: Medicaid Other | Admitting: Diagnostic Neuroimaging

## 2019-09-15 ENCOUNTER — Telehealth: Payer: Self-pay | Admitting: Internal Medicine

## 2019-09-16 ENCOUNTER — Other Ambulatory Visit: Payer: Self-pay | Admitting: Internal Medicine

## 2019-09-16 NOTE — Telephone Encounter (Signed)
LMTC x 1  

## 2019-09-16 NOTE — Telephone Encounter (Signed)
Spoke with pt and she is having car trouble and would like to cancel appt for lung screening through the grant on 09/22/19.  PT advised that I will call her back to reschedule once I have the future dates for the grant screenings. Pt verbalized understanding.

## 2019-09-22 ENCOUNTER — Ambulatory Visit: Payer: Medicaid Other

## 2019-10-16 ENCOUNTER — Telehealth: Payer: Self-pay | Admitting: Internal Medicine

## 2019-10-16 DIAGNOSIS — J449 Chronic obstructive pulmonary disease, unspecified: Secondary | ICD-10-CM

## 2019-10-16 NOTE — Telephone Encounter (Signed)
ATC patient.  LM to call back. 

## 2019-10-17 MED ORDER — ALBUTEROL SULFATE HFA 108 (90 BASE) MCG/ACT IN AERS
2.0000 | INHALATION_SPRAY | Freq: Four times a day (QID) | RESPIRATORY_TRACT | 11 refills | Status: DC | PRN
Start: 2019-10-17 — End: 2020-05-05

## 2019-10-17 NOTE — Telephone Encounter (Signed)
Spoke with pt. She is needing a refill on Albuterol HFA. This has been sent in. Pt stated that she did not need prednisone at this time. Nothing further was needed.

## 2019-10-27 ENCOUNTER — Ambulatory Visit: Payer: Medicaid Other

## 2019-10-27 ENCOUNTER — Telehealth: Payer: Self-pay | Admitting: Internal Medicine

## 2019-10-27 ENCOUNTER — Telehealth: Payer: Self-pay | Admitting: Acute Care

## 2019-10-27 NOTE — Telephone Encounter (Signed)
lmtcb for pt.  

## 2019-10-27 NOTE — Telephone Encounter (Signed)
Spoke with pt, states she is having car troubles and is unable to keep CT appt scheduled for 6:10 this evening.  I have cancelled appt. Will route to lung nodule pool for Vanessa Haas to reschedule when she returns to clinic.

## 2019-10-28 NOTE — Telephone Encounter (Signed)
Prefer televisit with NP but if not able to arrange then ok for Prednisone 10 mg take  4 each am x 2 days,   2 each am x 2 days,  1 each am x 2 days and stop

## 2019-10-28 NOTE — Telephone Encounter (Signed)
lmtcb for pt. Offer pt virtual visit with NP.

## 2019-10-28 NOTE — Telephone Encounter (Signed)
Called and spoke to pt. Pt c/o increase in SOB, prod cough with white mucus, chest congestion. Pt states she feels this is from the pollen and damp weather. Pt states her spo2 is 89% RA currently, it is normally 93% RA. Pt put herself on 2lpm O2 and sats increased to 93-94%. Pt denies CP/tightness, swelling, f/c/s, covid contacts. Pt states she is scheduled for her first COVID vaccine on  3/19. Pt is taking nyquil and zyrtec OTC and is using her rescue hfa 4 times a day and states this is normal for her. Pt has an in office visit on 11/24/19 with Dr. Sherene Sires.   Dr. Sherene Sires please advise. Thanks.

## 2019-10-29 NOTE — Telephone Encounter (Signed)
LMOM TCB x2 to offer virtual visit with APP

## 2019-10-30 NOTE — Telephone Encounter (Signed)
LMTCB x3 for pt.  

## 2019-10-30 NOTE — Telephone Encounter (Signed)
LMOM for pt that we will be having another lung cancer screening grant clinic in May 2021 but we do not have the exact date yet. I will call pt to get her rescheduled once we have this date. Nothing further needed at this time.

## 2019-11-03 NOTE — Telephone Encounter (Signed)
lmtcb X4 for pt.  Letter sent per triage protocol.  Will close encounter.

## 2019-11-07 ENCOUNTER — Telehealth: Payer: Self-pay | Admitting: Internal Medicine

## 2019-11-07 NOTE — Telephone Encounter (Signed)
LMTCB x 1 

## 2019-11-10 NOTE — Telephone Encounter (Signed)
lmtcb for pt.  

## 2019-11-11 NOTE — Telephone Encounter (Signed)
ATC pt, no answer. Left message for pt to call back.  Will close encounter due to multiple attempts to reach pt, per triage protocal.   

## 2019-11-17 ENCOUNTER — Ambulatory Visit: Payer: Medicaid Other | Admitting: Internal Medicine

## 2019-11-24 ENCOUNTER — Ambulatory Visit: Payer: Medicaid Other | Admitting: Internal Medicine

## 2019-11-27 ENCOUNTER — Ambulatory Visit (INDEPENDENT_AMBULATORY_CARE_PROVIDER_SITE_OTHER): Payer: Medicaid Other | Admitting: Internal Medicine

## 2019-11-27 ENCOUNTER — Other Ambulatory Visit: Payer: Self-pay

## 2019-11-27 ENCOUNTER — Encounter: Payer: Self-pay | Admitting: Internal Medicine

## 2019-11-27 DIAGNOSIS — J9611 Chronic respiratory failure with hypoxia: Secondary | ICD-10-CM

## 2019-11-27 DIAGNOSIS — J449 Chronic obstructive pulmonary disease, unspecified: Secondary | ICD-10-CM

## 2019-11-27 MED ORDER — PREDNISONE 10 MG PO TABS: ORAL_TABLET | ORAL | 0 refills | Status: DC

## 2019-11-27 MED ORDER — TRELEGY ELLIPTA 100-62.5-25 MCG/INH IN AEPB: 1.0000 | INHALATION_SPRAY | Freq: Every day | RESPIRATORY_TRACT | 0 refills | Status: DC

## 2019-11-27 NOTE — Assessment & Plan Note (Signed)
rec 02 titrate to sats > 90% reassess at ov planned    Each maintenance medication was reviewed in detail including most importantly the difference between maintenance and as needed and under what circumstances the prns are to be used.  Please see AVS for specific  Instructions which are unique to this visit and I personally typed out  which were reviewed in detail over the phone with the patient and a copy provided by mail

## 2019-11-27 NOTE — Progress Notes (Signed)
Vanessa Haas, female    DOB: 11/04/1960     MRN: [id number]   Brief patient profile:  35 yowf active smoker  With onset sob/cough around 2012 and progressively worse  on multiple (TNTC) inhalers/nebs since and noct 02 x 2018 so referred to pulmonary clinic 01/16/2019 by Norva Riffle      History of Present Illness  01/16/2019  Pulmonary/ 1st office eval/Vanessa Haas  Chief Complaint  Patient presents with  . Consult    Consult ST:MHDQ. States her oxygen drops to 63. She usually wears 2L. He states if he keeps the house temp cooler it helps with her breathing. Smokes 4 cigarettes per day.   Dyspnea:  Mostly rides cart when shops / doe x across the room, better with 02  Cough: "some but it's allergies" worse in ams better p neb/ min mucoid prod Sleep: disrupted at least once - twice noct and needs saba  SABA [id number] noted   rec Plan A = Automatic = Trelegy take two good drags - hold with right hand and hinge open  Plan B = Backup Only use your albuterol inhaler as a rescue medication Plan C = Crisis - only use your albuterol-ipatropium (prefer pure albuterol)  nebulizer if you first try Plan B and it fails to help > ok to use the nebulizer up to every 4 hours but if start needing it regularly call for immediate appointment For cough > mucinex dm up to1200 mg every 12 hours as needed  For nasal drainage/ throat irritation "allergy"  > zyrtec 10 mg daily as needed      Virtual Visit via Telephone Note 02/19/2019   I connected with Vanessa Haas on 02/19/19 at 10:45 AM EDT by telephone and verified that I am speaking with the correct person using two identifiers.   I discussed the limitations, risks, security and privacy concerns of performing an evaluation and management service by telephone and the availability of in person appointments. I also discussed with the patient that there may be a patient responsible charge related to this service. The patient expressed understanding and  agreed to proceed.   History of Present Illness: copd gold ? / still smoking   Dyspnea:  Still room to room  Cough: only in afternoons Sleeping: variably am congestion/ no purulent sputum   SABA use: a couple of times a day 02: has portable and machine not using either Called in for pred prn from pcp  rec No change in medications for  Now The key is to stop smoking completely before smoking completely stops you! Please schedule a follow up office visit in 6 weeks, call sooner if needed with all medications /inhalers/ solutions in hand so we can verify exactly what you are taking. This includes all medications from all doctors and over the counters    Virtual Visit via Telephone Note 04/03/2019 History of Present Illness: copd ? Gold on trelegy/ still smoking Dyspnea:  Improved vs last visit Cough: better if stays inside/ no am  Sleeping: better  SABA use: maybe once a day hfa if stays inside, apparently still using duoneb twice daily for reasons that aren't clear  rec Plan A = Automatic = trelegy one click each am  Plan B = Backup Only use your albuterol inhaler as a rescue medication  Plan C = Crisis - only use your albuterol nebulizer (NOT duoneb=  combined with ipatropium)  if you first try Plan B and it fails to help > ok  to use the nebulizer up to every 4 hours but if start needing it regularly call for immediate appointment   Virtual Visit via Telephone Note 06/12/2019   I connected with Vanessa Haas on 06/12/19 at   815 AM EDT by telephone and verified that I am speaking with the correct person using two identifiers.   I discussed the limitations, risks, security and privacy concerns of performing an evaluation and management service by telephone and the availability of in person appointments. I also discussed with the patient that there may be a patient responsible charge related to this service. The patient expressed understanding and agreed to proceed.   History of  Present Illness: still smoking but less  Dyspnea:  Was better until ran out of trelegy around the 1st of Oct > aecopd presently on pred/amox Cough: more since stopped trelegy> clear mucus Sleeping: once or twice needs neb while flat, 2 pillows  SABA use: one twice daytime daily when active  02: 2 lpm hs, as needed during the day  Observations/Objective: 97% at rest per husband  Sounds fine on the phone at rest s rattling cough/ good phonatin  rec 02 should be 2lpm at bedtime and goal is to keep it above 90% during the day with activity and  at rest by adjusting your flow rate  The key is to stop smoking completely before smoking completely stops you! You can use albuterol up to every 4 hours as needed  - first try the inhaler and then if not relieved at rest, use the nebulizer next (wait 15 minutes after the inhalers first to see if it helped)   Virtual Visit via Telephone Note 08/18/2019   I connected with Vanessa Haas on 08/18/19 at  9:45 AM EST by telephone and verified that I am speaking with the correct person using two identifiers.   I discussed the limitations, risks, security and privacy concerns of performing an evaluation and management service by telephone and the availability of in person appointments. I also discussed with the patient that there may be a patient responsible charge related to this service. The patient expressed understanding and agreed to proceed.   History of Present Illness: copd / last smoked around Jul 15 2019 Dyspnea:  Room to room / worse since last pred rx 3 weeks prior  Cough: worse pnds/ assoc nasal congestion but no purulent secretions/ also improved with pred SABA use: bid neb / increased since off pred 02: ok at rest / 2lpm at hs, prn daytime to keep sats > 90%  rec For drippy nose > zyrtec 10 mg daily as needed  For stuffy nose > zyrtec D one daily as needed > if not improving you will need to see ENT doctor- call for referral  Blow trelegy  out thru nose to get the benefit of the prednisone in the inhaler Prednisone 10 mg take  4 each am x 2 days,   2 each am x 2 days,  1 each am x 2 days and stop  Continue to adjust your daytime oyxgen to keep the saturations above 90%  Ok to reschedule your lung cancer screening CT for 3 months to avoid exposure to COVID 19 If not better by 2 weeks return with all your medications in hand - otherwise we can see you in 3 months   Acute Virtual Visit via Telephone Note 11/27/2019   I connected with Vanessa Haas on 11/27/19 at 11:00 AM EDT by telephone and  verified that I am speaking with the correct person using two identifiers.   I discussed the limitations, risks, security and privacy concerns of performing an evaluation and management service by telephone and the availability of in person appointments. I also discussed with the patient that there may be a patient responsible charge related to this service. The patient expressed understanding and agreed to proceed.   History of Present Illness:  Back to smoking again with ? aecopd x 4 days  Dyspnea:  Baseline mb and back then a lot worse x 4 d assoc with cough/ sneezing nasal congestion Cough: clear  Sleeping: bad attributes  to cough but not using cough meds at all, wants one called in  SABA use: 4-5 x daily plus nebulizer plus trelegy  02: 3lpm adusts    No obvious day to day or daytime variability or assoc excess/ purulent sputum or mucus plugs or hemoptysis or cp or chest tightness, subjective wheeze or overt sinus or hb symptoms.    Also denies any obvious fluctuation of symptoms with weather or environmental changes or other aggravating or alleviating factors except as outlined above.   Meds reviewed/ med reconciliation completed          Observations/Objective: Sounds sleepy, mod hoarse / congested sounding cough but speaking in full phrases s conversational sob    Assessment and Plan: See problem list for active  a/p's   Follow Up Instructions: See avs for instructions unique to this ov which includes revised/ updated med list     I discussed the assessment and treatment plan with the patient. The patient was provided an opportunity to ask questions and all were answered. The patient agreed with the plan and demonstrated an understanding of the instructions.   The patient was advised to call back or seek an in-person evaluation if the symptoms worsen or if the condition fails to improve as anticipated.  I provided 22  minutes of non-face-to-face time during this encounter.   Sandrea Hughs, MD              Past Medical History:  Diagnosis Date  . Anxiety   . Bipolar 1 disorder, depressed (HCC)   . CAD (coronary artery disease)   . Cataract   . COPD (chronic obstructive pulmonary disease) (HCC)   . Depression   . Hepatitis C    Type II  . Hyperlipidemia   . Hypertension   . Migraine   . Neuropathy   . Seizures (HCC)   . Substance abuse (HCC)    drug free x4 mos (cocaine)

## 2019-11-27 NOTE — Assessment & Plan Note (Signed)
Active smoker - 01/16/2019  After extensive coaching inhaler device,  effectiveness =    90% with dpi, 50% with hfa   Acute flare in setting of rhintis with clear mucus ? Allergic mech   Rec:  Continue trelegy one click each am  saba prn  Prednisone 10 mg take  4 each am x 2 days,   2 each am x 2 days,  1 each am x 2 days and stop  F/u 4 weeks to regroup with all meds in hand using a trust but verify approach to confirm accurate Medication  Reconciliation The principal here is that until we are certain that the  patients are doing what we've asked, it makes no sense to ask them to do more.

## 2019-11-27 NOTE — Patient Instructions (Signed)
Prednisone 10 mg take  4 each am x 2 days,   2 each am x 2 days,  1 each am x 2 days and stop   The key is to stop smoking completely before smoking completely stops you!  For smoking cessation information call [phone]    For cough > mucinex dm 1200 mg every 12 hours or delsym liquid 2 tsp every 12 hours are the strongest you can get without a precription  Continue trelegy on click first thing each am   Only use your albuterol as a rescue medication to be used if you can't catch your breath by resting or doing a relaxed purse lip breathing pattern.  - The less you use it, the better it will work when you need it. - Ok to use up to 2 puffs  every 4 hours if you must but call for immediate appointment if use goes up over your usual need - Don't leave home without it !!  (think of it like the spare tire for your car)    Please schedule a follow up office visit in 4 weeks, sooner if needed  with all medications /inhalers/ solutions in hand so we can verify exactly what you are taking. This includes all medications from all doctors and over the counters

## 2019-11-28 ENCOUNTER — Telehealth: Payer: Self-pay | Admitting: Internal Medicine

## 2019-11-28 MED ORDER — PREDNISONE 10 MG PO TABS: ORAL_TABLET | ORAL | 0 refills | Status: DC

## 2019-11-28 NOTE — Telephone Encounter (Signed)
Prednisone prescription was sent to the wrong pharmacy yesterday. Spoke with pt. Made her aware that I have fixed this and sent it to the correct pharmacy. Nothing further is needed.

## 2019-12-01 ENCOUNTER — Encounter: Payer: Self-pay | Admitting: Diagnostic Neuroimaging

## 2019-12-01 ENCOUNTER — Ambulatory Visit: Payer: Medicaid Other | Admitting: Diagnostic Neuroimaging

## 2019-12-01 ENCOUNTER — Encounter: Payer: Self-pay | Admitting: *Deleted

## 2019-12-01 ENCOUNTER — Telehealth: Payer: Self-pay | Admitting: *Deleted

## 2019-12-01 NOTE — Telephone Encounter (Signed)
Patient was no show for new patient appointment today. 

## 2019-12-25 ENCOUNTER — Ambulatory Visit: Payer: Medicaid Other | Admitting: Internal Medicine

## 2019-12-29 ENCOUNTER — Telehealth: Payer: Self-pay | Admitting: Acute Care

## 2019-12-29 ENCOUNTER — Ambulatory Visit: Payer: Medicaid Other | Admitting: Acute Care

## 2019-12-29 NOTE — Telephone Encounter (Signed)
LMTC x 1  - This is a lung screening grant pt. Please leave in my box to follow up on.

## 2020-01-01 NOTE — Telephone Encounter (Signed)
Will close this message and refer to referral notes 

## 2020-02-02 ENCOUNTER — Encounter: Payer: Self-pay | Admitting: Internal Medicine

## 2020-02-02 ENCOUNTER — Ambulatory Visit (INDEPENDENT_AMBULATORY_CARE_PROVIDER_SITE_OTHER): Payer: Medicaid Other | Admitting: Internal Medicine

## 2020-02-02 ENCOUNTER — Other Ambulatory Visit: Payer: Self-pay

## 2020-02-02 DIAGNOSIS — J9611 Chronic respiratory failure with hypoxia: Secondary | ICD-10-CM

## 2020-02-02 DIAGNOSIS — Z87891 Personal history of nicotine dependence: Secondary | ICD-10-CM

## 2020-02-02 DIAGNOSIS — J449 Chronic obstructive pulmonary disease, unspecified: Secondary | ICD-10-CM | POA: Diagnosis not present

## 2020-02-02 MED ORDER — AZITHROMYCIN 250 MG PO TABS: ORAL_TABLET | ORAL | 0 refills | Status: DC

## 2020-02-02 MED ORDER — PREDNISONE 10 MG PO TABS: ORAL_TABLET | ORAL | 0 refills | Status: DC

## 2020-02-02 NOTE — Assessment & Plan Note (Signed)
Rec: Make sure you check your oxygen saturations at highest level of activity to be sure it stays over 90% and adjust upward to maintain this level if needed but remember to turn it back to previous settings when you stop (to conserve your supply).

## 2020-02-02 NOTE — Progress Notes (Addendum)
Vanessa Haas, female    DOB: [birth year]     MRN: [id number]   Brief patient profile:  32 yowf active smoker  With onset sob/cough around 2012 and progressively worse  on multiple (TNTC) inhalers/nebs since and noct 02 x 2018 so referred to pulmonary clinic 01/16/2019 by Norva Riffle      History of Present Illness  01/16/2019  Pulmonary/ 1st office eval/Bethel Sirois  Chief Complaint  Patient presents with  . Consult    Consult [id number]. States her oxygen drops to 63. She usually wears 2L. He states if he keeps the house temp cooler it helps with her breathing. Smokes 4 cigarettes per day.   Dyspnea:  Mostly rides cart when shops / doe x across the room, better with 02  Cough: "some but it's allergies" worse in ams better p neb/ min mucoid prod Sleep: disrupted at least once - twice noct and needs saba  SABA [id number] noted   rec Plan A = Automatic = Trelegy take two good drags - hold with right hand and hinge open  Plan B = Backup Only use your albuterol inhaler as a rescue medication Plan C = Crisis - only use your albuterol-ipatropium (prefer pure albuterol)  nebulizer if you first try Plan B and it fails to help > ok to use the nebulizer up to every 4 hours but if start needing it regularly call for immediate appointment For cough > mucinex dm up to1200 mg every 12 hours as needed  For nasal drainage/ throat irritation "allergy"  > zyrtec 10 mg daily as needed      Virtual Visit via Telephone Note 02/19/2019   I connected with Vanessa Haas on 02/19/19 at 10:45 AM EDT by telephone and verified that I am speaking with the correct person using two identifiers.   Pt is at home and this call was placed from my office with no other participants on the call.   I discussed the limitations, risks, security and privacy concerns of performing an evaluation and management service by telephone and the availability of in person appointments. I also discussed with the patient that there  may be a patient responsible charge related to this service. The patient expressed understanding and agreed to proceed.   History of Present Illness: copd gold ? / still smoking   Dyspnea:  Still room to room  Cough: only in afternoons Sleeping: variably am congestion/ no purulent sputum   SABA use: a couple of times a day 02: has portable and machine not using either Called in for pred prn from pcp  rec No change in medications for  Now The key is to stop smoking completely before smoking completely stops you! Please schedule a follow up office visit in 6 weeks, call sooner if needed with all medications /inhalers/ solutions in hand so we can verify exactly what you are taking. This includes all medications from all doctors and over the counters    Virtual Visit via Telephone Note 04/03/2019 History of Present Illness: copd ? Gold on trelegy/ still smoking Dyspnea:  Improved vs last visit Cough: better if stays inside/ no am  Sleeping: better  SABA use: maybe once a day hfa if stays inside, apparently still using duoneb twice daily for reasons that aren't clear  rec Plan A = Automatic = trelegy one click each am  Plan B = Backup Only use your albuterol inhaler as a rescue medication  Plan C = Crisis - only use your albuterol  nebulizer (NOT duoneb=  combined with ipatropium)  if you first try Plan B and it fails to help > ok to use the nebulizer up to every 4 hours but if start needing it regularly call for immediate appointment   Virtual Visit via Telephone Note 06/12/2019   I connected with Vanessa Haas on 06/12/19 at   815 AM EDT by telephone and verified that I am speaking with the correct person using two identifiers.   I discussed the limitations, risks, security and privacy concerns of performing an evaluation and management service by telephone and the availability of in person appointments. I also discussed with the patient that there may be a patient responsible charge  related to this service. The patient expressed understanding and agreed to proceed.   History of Present Illness: still smoking but less  Dyspnea:  Was better until ran out of trelegy around the 1st of Oct > aecopd presently on pred/amox Cough: more since stopped trelegy> clear mucus Sleeping: once or twice needs neb while flat, 2 pillows  SABA use: one twice daytime daily when active  02: 2 lpm hs, as needed during the day  Observations/Objective: 97% at rest per husband  Sounds fine on the phone at rest s rattling cough/ good phonatin  rec 02 should be 2lpm at bedtime and goal is to keep it above 90% during the day with activity and  at rest by adjusting your flow rate  The key is to stop smoking completely before smoking completely stops you! You can use albuterol up to every 4 hours as needed  - first try the inhaler and then if not relieved at rest, use the nebulizer next (wait 15 minutes after the inhalers first to see if it helped)   Virtual Visit via Telephone Note 08/18/2019   I connected with Vanessa Haas on 08/18/19 at  9:45 AM EST by telephone and verified that I am speaking with the correct person using two identifiers.   I discussed the limitations, risks, security and privacy concerns of performing an evaluation and management service by telephone and the availability of in person appointments. I also discussed with the patient that there may be a patient responsible charge related to this service. The patient expressed understanding and agreed to proceed.   History of Present Illness: copd / last smoked around Jul 15 2019 Dyspnea:  Room to room / worse since last pred rx 3 weeks prior  Cough: worse pnds/ assoc nasal congestion but no purulent secretions/ also improved with pred SABA use: bid neb / increased since off pred 02: ok at rest / 2lpm at hs, prn daytime to keep sats > 90%  rec For drippy nose > zyrtec 10 mg daily as needed  For stuffy nose > zyrtec D one  daily as needed > if not improving you will need to see ENT doctor- call for referral  Blow trelegy out thru nose to get the benefit of the prednisone in the inhaler Prednisone 10 mg take  4 each am x 2 days,   2 each am x 2 days,  1 each am x 2 days and stop  Continue to adjust your daytime oyxgen to keep the saturations above 90%  Ok to reschedule your lung cancer screening CT for 3 months to avoid exposure to COVID 19 If not better by 2 weeks return with all your medications in hand - otherwise we can see you in 3 months   Acute Virtual Visit  via Telephone Note 11/27/2019  History of Present Illness:  Back to smoking again with ? aecopd x 4 days  Dyspnea:  Baseline mb and back then a lot worse x 4 d assoc with cough/ sneezing nasal congestion Cough: clear  Sleeping: bad attributes  to cough but not using cough meds at all, wants one called in  SABA use: 4-5 x daily plus nebulizer plus trelegy  02: 3lpm adusts  rec Prednisone 10 mg take  4 each am x 2 days,   2 each am x 2 days,  1 each am x 2 days and stop  The key is to stop smoking completely before smoking completely stops you! For cough > mucinex dm 1200 mg every 12 hours or delsym liquid 2 tsp every 12 hours are the strongest you can get without a precription Continue trelegy on click first thing each am  Only use your albuterol as a rescue medication to be used  Please schedule a follow up office visit in 4 weeks, sooner if needed  with all medications /inhalers/ solutions in hand so we can verify exactly what you are taking. This includes all medications from all doctors and over the counters   Virtual Visit via Telephone Note 02/02/2020   I connected with Vanessa Haas on 02/02/20 at  8:20 AM EDT by telephone and verified that I am speaking with the correct person using two identifiers.   I discussed the limitations, risks, security and privacy concerns of performing an evaluation and management service by telephone and the  availability of in person appointments. I also discussed with the patient that there may be a patient responsible charge related to this service. The patient expressed understanding and agreed to proceed.   History of Present Illness:  2nd covid vaccine  In April 2021  Dyspnea: gets off at door, uses scooter to shop /washes dishes, eating better  Cough: 6/19  More cough / turning yellow and increased albuterol  Sleeping: on back 2 pillows SABA use: baseline twice daily  02: uses prn /  At rest  Mid 90s , never check it walking    No obvious day to day or daytime variability or assoc   mucus plugs or hemoptysis or cp or chest tightness, subjective wheeze or overt sinus or hb symptoms.    Also denies any obvious fluctuation of symptoms with weather or environmental changes or other aggravating or alleviating factors except as outlined above.   Meds reviewed/ med reconciliation completed     No outpatient medications have been marked as taking for the 02/02/20 encounter (Office Visit) with Nyoka Cowden, MD.         Observations/Objective: Somewhat hoarse, congested rattling cough on voluntary maneuver no sob talking/     Assessment and Plan: See problem list for active a/p's   Follow Up Instructions: See avs for instructions unique to this ov which includes revised/ updated med list     I discussed the assessment and treatment plan with the patient. The patient was provided an opportunity to ask questions and all were answered. The patient agreed with the plan and demonstrated an understanding of the instructions.   The patient was advised to call back or seek an in-person evaluation if the symptoms worsen or if the condition fails to improve as anticipated.  I provided 25 minutes of non-face-to-face time during this encounter.   Sandrea Hughs, MD  Past Medical History:  Diagnosis Date  . Anxiety   . Bipolar 1 disorder, depressed (HCC)    . CAD (coronary artery disease)   . Cataract   . COPD (chronic obstructive pulmonary disease) (HCC)   . Depression   . Hepatitis C    Type II  . Hyperlipidemia   . Hypertension   . Migraine   . Neuropathy   . Seizures (HCC)   . Substance abuse (HCC)    drug free x4 mos (cocaine)

## 2020-02-02 NOTE — Patient Instructions (Addendum)
Prednisone 10 mg take  4 each am x 2 days,   2 each am x 2 days,  1 each am x 2 days and stop   Zpak   The key is to stop smoking completely before smoking completely stops you!  For smoking cessation information call [phone]    Make sure you check your oxygen saturations at highest level of activity to be sure it stays over 90% and adjust upward to maintain this level if needed but remember to turn it back to previous settings when you stop (to conserve your supply).    For cough > mucinex dm 1200 mg every 12 hours or for dry cough  delsym liquid 2 tsp every 12 hours are the strongest you can get without a precription  Continue trelegy on click first thing each am   Only use your albuterol as a rescue medication to be used if you can't catch your breath by resting or doing a relaxed purse lip breathing pattern.  - The less you use it, the better it will work when you need it. - Ok to use up to 2 puffs  every 4 hours if you must but call for immediate appointment if use goes up over your usual need - Don't leave home without it !!  (think of it like the spare tire for your car)    Please schedule a follow up office visit in 3 months, sooner if needed  with all medications /inhalers/ solutions in hand so we can verify exactly what you are taking. This includes all medications from all doctors and over the counters  PFTs on return

## 2020-02-02 NOTE — Assessment & Plan Note (Signed)
Active smoker - 01/16/2019  After extensive coaching inhaler device,  effectiveness =    90% with dpi, 50% with hfa   Mild flare p contact with husband's cold   rec pred x 6 days/ zpak    Group D in terms of symptom/risk and laba/lama/ICS  therefore appropriate rx at this point >>>  Continue trelegy daily   I spent extra time with pt today reviewing appropriate use of albuterol for prn use on exertion with the following points: 1) saba is for relief of sob that does not improve by walking a slower pace or resting but rather if the pt does not improve after trying this first. 2) If the pt is convinced, as many are, that saba helps recover from activity faster then it's easy to tell if this is the case by re-challenging : ie stop, take the inhaler, then p 5 minutes try the exact same activity (intensity of workload) that just caused the symptoms and see if they are substantially diminished or not after saba 3) if there is an activity that reproducibly causes the symptoms, try the saba 15 min before the activity on alternate days   If in fact the saba really does help, then fine to continue to use it prn but advised may need to look closer at the maintenance regimen being used to achieve better control of airways disease with exertion.

## 2020-02-02 NOTE — Assessment & Plan Note (Signed)
Quit December/2020 > still smoking 02/02/2020   Counseled re importance of smoking cessation but did not meet time criteria for separate billing    Each maintenance medication was reviewed in detail including most importantly the difference between maintenance and as needed and under what circumstances the prns are to be used.  Please see AVS for specific  Instructions which are unique to this visit and I personally typed out  which were reviewed in detail over the phone with the patient and a copy provided via regular mail

## 2020-02-10 ENCOUNTER — Telehealth: Payer: Self-pay | Admitting: Internal Medicine

## 2020-02-10 NOTE — Telephone Encounter (Signed)
Spoke with patient/boyfriend (listed on DPR).  Advised on Dr. Thurston Hole recommendations, no inpatient visits available this week.  Scheduled for virtual visit with Beth for Wednesday at 1:30 pm.  Both the patient and boyfriend verbalized understanding. Dr. Sherene Sires, I scheduled her a televisit with Beth for tomorrow at 1:30 pm.

## 2020-02-10 NOTE — Telephone Encounter (Signed)
If we can't see her this week in person some where (including our NP's) then set up televesit but warn her this will be an add on and most likely won't get to her until lunchtime - if conditions worsens in meantime should go to [facility] or [facility]/ not [facility] if she wants out input on discharge planning

## 2020-02-10 NOTE — Telephone Encounter (Signed)
Called spoke with patient and boyfriend. They state patient will get up out of chair on 2L oxygen and sats will drop to 78% patient puts oxygen to 4L and maintains at 92%.  Patient is inquiring about something that can help open her lungs to help her breathe better and a CPAP for her end stage COPD.  Patient is taking trelegy and albuterol nebulizer as prescribed.   Patient was admitted to [facility] from 02/06/20-02/08/20.  Let patient know Dr. Sherene Sires was in [place] today and once we heard back we would share his recommendations. Patient states she would go to [place] if she could be seen today.   Dr. Sherene Sires please advise

## 2020-02-10 NOTE — Telephone Encounter (Signed)
That's fine thx

## 2020-02-11 ENCOUNTER — Telehealth: Payer: Self-pay | Admitting: Primary Care

## 2020-02-11 ENCOUNTER — Other Ambulatory Visit: Payer: Self-pay

## 2020-02-11 ENCOUNTER — Ambulatory Visit (INDEPENDENT_AMBULATORY_CARE_PROVIDER_SITE_OTHER): Payer: Medicaid Other | Admitting: Primary Care

## 2020-02-11 DIAGNOSIS — J9621 Acute and chronic respiratory failure with hypoxia: Secondary | ICD-10-CM

## 2020-02-11 DIAGNOSIS — J449 Chronic obstructive pulmonary disease, unspecified: Secondary | ICD-10-CM

## 2020-02-11 MED ORDER — PREDNISONE 10 MG PO TABS
10.0000 mg | ORAL_TABLET | Freq: Every day | ORAL | 0 refills | Status: DC
Start: 2020-02-11 — End: 2020-02-15

## 2020-02-11 MED ORDER — BREZTRI AEROSPHERE 160-9-4.8 MCG/ACT IN AERO: 2.0000 | INHALATION_SPRAY | Freq: Every day | RESPIRATORY_TRACT | 0 refills | Status: DC

## 2020-02-11 NOTE — Telephone Encounter (Signed)
Please put two samples of Breztri up front for Ms Frate. She needs a follow up with Dr. Sherene Sires first available in office

## 2020-02-11 NOTE — Progress Notes (Signed)
Virtual Visit via Telephone Note  I connected with Vanessa Haas on 02/11/20 at  1:30 PM EDT by telephone and verified that I am speaking with the correct person using two identifiers.  Location: Patient: Home Provider: Office   I discussed the limitations, risks, security and privacy concerns of performing an evaluation and management service by telephone and the availability of in person appointments. I also discussed with the patient that there may be a patient responsible charge related to this service. The patient expressed understanding and agreed to proceed.   History of Present Illness: 59 year old female, former smoker quit in November 2020 (44-pack-year history).  Medical history significant for COPD question group D, chronic respiratory failure with hypoxia, former smoker, anxiety, bipolar.  Patient of Dr. Sherene Sires seen 02/02/2020.  Previous [facility] pulmonary encounters:  02/02/20- Virtual Visit via Telephone Note   Dyspnea: gets off at door, uses scooter to shop /washes dishes, eating better  Cough: 6/19  More cough / turning yellow and increased albuterol  Sleeping: on back 2 pillows SABA use: baseline twice daily  02: uses prn /  At rest  Mid 90s , never check it walking   No obvious day to day or daytime variability or assoc   mucus plugs or hemoptysis or cp or chest tightness, subjective wheeze or overt sinus or hb symptoms.    02/11/2020 -Interim history  Patient contacted today for televisit. Patient had a recent admission at an outside hospital for dyspnea and acute respiratory failure. She was intubated from 6/25-6/27. She left AMA. She called our office yesterday 02/10/2020 with reports of oxygen desaturation on 2L oxygen. She increased her oxygen to 4 L and was able to keep her oxygen level >90-92%.  She is maintained on Trelegy and albuterol nebulizer as prescribed. She at times has trouble getting a big enough breath to use her Trelegy inhaler. Reports that her oxygen  level will drop to on exertion or while sitting on room air. She is currently wearing 2L oxygen at rest; 4L on exertion Prior she only used oxygen as needed. She is wondering if there is a stronger medication for COPD. Denies fever, chills, sweats, N/V/D.    Observations/Objective:  - Easily winded when talking - She is currently wearing 2L oxygen at rest; 4L on exertion  Assessment and Plan:  COPD / ? Group D:  - GOLD GROUP D in terms of symptoms  - Stop Trelegy; Trial Breztri- take 2 puffs morning and 2 puffs in the evening  - Start prednisone 10mg  daily x 2 weeks (stay on this dose until next follow-up) - Continue Albuterol hfa/nebulizer every 4-6 hours for breakthrough shortness of breath or wheezing  Acute on chronic respiratory failure - Recommend decreasing Seroquel to 25mg  at bedtime - Continue oxygen 2-4L to maintain O2  >88-92%  Follow Up Instructions:   - Next available with Dr. in office or APP  I discussed the assessment and treatment plan with the patient. The patient was provided an opportunity to ask questions and all were answered. The patient agreed with the plan and demonstrated an understanding of the instructions.   The patient was advised to call back or seek an in-person evaluation if the symptoms worsen or if the condition fails to improve as anticipated.  I provided 25 minutes of non-face-to-face time during this encounter.   , NP

## 2020-02-11 NOTE — Patient Instructions (Addendum)
Orders: Stop Trelegy  Trial Breztri- take 2 puffs morning and 2 puffs in the evening  Start prednisone 10mg  daily   Follow-up: Next available with Dr. in office

## 2020-02-11 NOTE — Telephone Encounter (Signed)
Called and left a VM for patient to call the office to schedule a f/u with Dr. Sherene Sires and to let her know that there are 2 Breztri samples at the front desk for her to pick up at her convince.

## 2020-02-12 ENCOUNTER — Telehealth: Payer: Self-pay | Admitting: Internal Medicine

## 2020-02-12 NOTE — Telephone Encounter (Signed)
Spoke with Vanessa Haas regarding new inhaler, [name], advised she is to use 2 puffs twice a day and to ensure she rinses/gargles after use.  He verbalized understanding.  Nothing further needed.

## 2020-02-14 ENCOUNTER — Inpatient Hospital Stay (HOSPITAL_COMMUNITY)
Admission: EM | Admit: 2020-02-14 | Discharge: 2020-02-15 | DRG: 208 | Disposition: A | Discharge status: Home / Self Care | Payer: Medicaid Other | Attending: Internal Medicine | Admitting: Internal Medicine

## 2020-02-14 ENCOUNTER — Emergency Department (HOSPITAL_COMMUNITY): Payer: Medicaid Other

## 2020-02-14 ENCOUNTER — Other Ambulatory Visit: Payer: Self-pay

## 2020-02-14 ENCOUNTER — Encounter (HOSPITAL_COMMUNITY): Payer: Self-pay | Admitting: *Deleted

## 2020-02-14 ENCOUNTER — Inpatient Hospital Stay (HOSPITAL_COMMUNITY): Payer: Medicaid Other

## 2020-02-14 DIAGNOSIS — R0602 Shortness of breath: Secondary | ICD-10-CM | POA: Diagnosis present

## 2020-02-14 DIAGNOSIS — I2510 Atherosclerotic heart disease of native coronary artery without angina pectoris: Secondary | ICD-10-CM | POA: Diagnosis present

## 2020-02-14 DIAGNOSIS — Z87891 Personal history of nicotine dependence: Secondary | ICD-10-CM | POA: Diagnosis not present

## 2020-02-14 DIAGNOSIS — Z9981 Dependence on supplemental oxygen: Secondary | ICD-10-CM | POA: Diagnosis not present

## 2020-02-14 DIAGNOSIS — F3130 Bipolar disorder, current episode depressed, mild or moderate severity, unspecified: Secondary | ICD-10-CM | POA: Diagnosis present

## 2020-02-14 DIAGNOSIS — F419 Anxiety disorder, unspecified: Secondary | ICD-10-CM | POA: Diagnosis present

## 2020-02-14 DIAGNOSIS — J9622 Acute and chronic respiratory failure with hypercapnia: Secondary | ICD-10-CM | POA: Diagnosis present

## 2020-02-14 DIAGNOSIS — Z20822 Contact with and (suspected) exposure to covid-19: Secondary | ICD-10-CM | POA: Diagnosis present

## 2020-02-14 DIAGNOSIS — R739 Hyperglycemia, unspecified: Secondary | ICD-10-CM | POA: Diagnosis present

## 2020-02-14 DIAGNOSIS — F1320 Sedative, hypnotic or anxiolytic dependence, uncomplicated: Secondary | ICD-10-CM | POA: Diagnosis present

## 2020-02-14 DIAGNOSIS — I10 Essential (primary) hypertension: Secondary | ICD-10-CM | POA: Diagnosis present

## 2020-02-14 DIAGNOSIS — J440 Chronic obstructive pulmonary disease with acute lower respiratory infection: Secondary | ICD-10-CM | POA: Diagnosis present

## 2020-02-14 DIAGNOSIS — T380X5A Adverse effect of glucocorticoids and synthetic analogues, initial encounter: Secondary | ICD-10-CM | POA: Diagnosis present

## 2020-02-14 DIAGNOSIS — Z79899 Other long term (current) drug therapy: Secondary | ICD-10-CM

## 2020-02-14 DIAGNOSIS — G9341 Metabolic encephalopathy: Secondary | ICD-10-CM | POA: Diagnosis present

## 2020-02-14 DIAGNOSIS — E785 Hyperlipidemia, unspecified: Secondary | ICD-10-CM | POA: Diagnosis present

## 2020-02-14 DIAGNOSIS — Z90710 Acquired absence of both cervix and uterus: Secondary | ICD-10-CM | POA: Diagnosis not present

## 2020-02-14 DIAGNOSIS — J9621 Acute and chronic respiratory failure with hypoxia: Secondary | ICD-10-CM | POA: Diagnosis present

## 2020-02-14 DIAGNOSIS — Z0189 Encounter for other specified special examinations: Secondary | ICD-10-CM

## 2020-02-14 DIAGNOSIS — J441 Chronic obstructive pulmonary disease with (acute) exacerbation: Secondary | ICD-10-CM | POA: Diagnosis present

## 2020-02-14 DIAGNOSIS — J9690 Respiratory failure, unspecified, unspecified whether with hypoxia or hypercapnia: Secondary | ICD-10-CM

## 2020-02-14 DIAGNOSIS — J9620 Acute and chronic respiratory failure, unspecified whether with hypoxia or hypercapnia: Secondary | ICD-10-CM

## 2020-02-14 DIAGNOSIS — J189 Pneumonia, unspecified organism: Secondary | ICD-10-CM | POA: Diagnosis present

## 2020-02-14 DIAGNOSIS — Z7951 Long term (current) use of inhaled steroids: Secondary | ICD-10-CM | POA: Diagnosis not present

## 2020-02-14 DIAGNOSIS — Z9289 Personal history of other medical treatment: Secondary | ICD-10-CM

## 2020-02-14 LAB — COMPREHENSIVE METABOLIC PANEL
ALT: 23 U/L (ref 0–44)
AST: 31 U/L (ref 15–41)
Albumin: 3.9 g/dL (ref 3.5–5.0)
Alkaline Phosphatase: 54 U/L (ref 38–126)
Anion gap: 10 (ref 5–15)
BUN: 9 mg/dL (ref 6–20)
CO2: 35 mmol/L — ABNORMAL HIGH (ref 22–32)
Calcium: 9.2 mg/dL (ref 8.9–10.3)
Chloride: 95 mmol/L — ABNORMAL LOW (ref 98–111)
Creatinine, Ser: 0.79 mg/dL (ref 0.44–1.00)
GFR calc Af Amer: >60 mL/min (ref >60)
GFR calc non Af Amer: >60 mL/min (ref >60)
Glucose, Bld: 170 mg/dL — ABNORMAL HIGH (ref 70–99)
Potassium: 5.3 mmol/L — ABNORMAL HIGH (ref 3.5–5.1)
Sodium: 140 mmol/L (ref 135–145)
Total Bilirubin: 1.1 mg/dL (ref 0.3–1.2)
Total Protein: 7.1 g/dL (ref 6.5–8.1)

## 2020-02-14 LAB — MAGNESIUM
Magnesium: 2.4 mg/dL (ref 1.7–2.4)
Magnesium: 2.1 mg/dL (ref 1.7–2.4)

## 2020-02-14 LAB — CBC WITH DIFFERENTIAL/PLATELET
Abs Immature Granulocytes: 0.37 10*3/uL — ABNORMAL HIGH (ref 0.00–0.07)
Basophils Absolute: 0.1 10*3/uL (ref 0.0–0.1)
Basophils Relative: 1 %
Eosinophils Absolute: 0.2 10*3/uL (ref 0.0–0.5)
Eosinophils Relative: 1 %
HCT: 51.4 % — ABNORMAL HIGH (ref 36.0–46.0)
Hemoglobin: 14.8 g/dL (ref 12.0–15.0)
Immature Granulocytes: 2 %
Lymphocytes Relative: 9 %
Lymphs Abs: 2 10*3/uL (ref 0.7–4.0)
MCH: 29 pg (ref 26.0–34.0)
MCHC: 28.8 g/dL — ABNORMAL LOW (ref 30.0–36.0)
MCV: 100.8 fL — ABNORMAL HIGH (ref 80.0–100.0)
Monocytes Absolute: 1.4 10*3/uL — ABNORMAL HIGH (ref 0.1–1.0)
Monocytes Relative: 7 %
Neutro Abs: 18 10*3/uL — ABNORMAL HIGH (ref 1.7–7.7)
Neutrophils Relative %: 80 %
Platelets: 279 10*3/uL (ref 150–400)
RBC: 5.1 MIL/uL (ref 3.87–5.11)
RDW: 13.5 % (ref 11.5–15.5)
WBC: 22.1 10*3/uL — ABNORMAL HIGH (ref 4.0–10.5)
nRBC: 0 % (ref 0.0–0.2)

## 2020-02-14 LAB — BASIC METABOLIC PANEL
Anion gap: 12 (ref 5–15)
BUN: 9 mg/dL (ref 6–20)
CO2: 32 mmol/L (ref 22–32)
Calcium: 9 mg/dL (ref 8.9–10.3)
Chloride: 97 mmol/L — ABNORMAL LOW (ref 98–111)
Creatinine, Ser: 0.75 mg/dL (ref 0.44–1.00)
GFR calc Af Amer: >60 mL/min (ref >60)
GFR calc non Af Amer: >60 mL/min (ref >60)
Glucose, Bld: 175 mg/dL — ABNORMAL HIGH (ref 70–99)
Potassium: 4.6 mmol/L (ref 3.5–5.1)
Sodium: 141 mmol/L (ref 135–145)

## 2020-02-14 LAB — GLUCOSE, CAPILLARY
Glucose-Capillary: 146 mg/dL — ABNORMAL HIGH (ref 70–99)
Glucose-Capillary: 70 mg/dL (ref 70–99)
Glucose-Capillary: 133 mg/dL — ABNORMAL HIGH (ref 70–99)
Glucose-Capillary: 146 mg/dL — ABNORMAL HIGH (ref 70–99)
Glucose-Capillary: 146 mg/dL — ABNORMAL HIGH (ref 70–99)
Glucose-Capillary: 137 mg/dL — ABNORMAL HIGH (ref 70–99)

## 2020-02-14 LAB — SARS CORONAVIRUS 2 BY RT PCR (HOSPITAL ORDER, PERFORMED IN ~~LOC~~ HOSPITAL LAB): SARS Coronavirus 2: NEGATIVE

## 2020-02-14 LAB — BLOOD GAS, ARTERIAL
Acid-Base Excess: 12.4 mmol/L — ABNORMAL HIGH (ref 0.0–2.0)
Bicarbonate: 42.2 mmol/L — ABNORMAL HIGH (ref 20.0–28.0)
Drawn by: 41422
FIO2: 60
O2 Saturation: 96.1 %
Patient temperature: 35.8
pCO2 arterial: >120 mmHg (ref 32.0–48.0)
pH, Arterial: 7.111 — CL (ref 7.350–7.450)
pO2, Arterial: 98 mmHg (ref 83.0–108.0)

## 2020-02-14 LAB — PHOSPHORUS
Phosphorus: 3.1 mg/dL (ref 2.5–4.6)
Phosphorus: 2.5 mg/dL (ref 2.5–4.6)

## 2020-02-14 LAB — CBC
HCT: 47.9 % — ABNORMAL HIGH (ref 36.0–46.0)
Hemoglobin: 14.4 g/dL (ref 12.0–15.0)
MCH: 30.1 pg (ref 26.0–34.0)
MCHC: 30.1 g/dL (ref 30.0–36.0)
MCV: 100.2 fL — ABNORMAL HIGH (ref 80.0–100.0)
Platelets: 147 10*3/uL — ABNORMAL LOW (ref 150–400)
RBC: 4.78 MIL/uL (ref 3.87–5.11)
RDW: 13.3 % (ref 11.5–15.5)
WBC: 15.3 10*3/uL — ABNORMAL HIGH (ref 4.0–10.5)
nRBC: 0 % (ref 0.0–0.2)

## 2020-02-14 LAB — RAPID URINE DRUG SCREEN, HOSP PERFORMED
Amphetamines: NOT DETECTED
Barbiturates: NOT DETECTED
Benzodiazepines: POSITIVE — AB
Cocaine: NOT DETECTED
Opiates: NOT DETECTED
Tetrahydrocannabinol: POSITIVE — AB

## 2020-02-14 LAB — TRIGLYCERIDES: Triglycerides: 126 mg/dL (ref <150)

## 2020-02-14 LAB — TROPONIN I (HIGH SENSITIVITY)
Troponin I (High Sensitivity): 15 ng/L (ref <18)
Troponin I (High Sensitivity): 17 ng/L (ref <18)

## 2020-02-14 LAB — PROCALCITONIN: Procalcitonin: <0.1 ng/mL

## 2020-02-14 LAB — CBG MONITORING, ED: Glucose-Capillary: 142 mg/dL — ABNORMAL HIGH (ref 70–99)

## 2020-02-14 LAB — MRSA PCR SCREENING: MRSA by PCR: NEGATIVE

## 2020-02-14 LAB — STREP PNEUMONIAE URINARY ANTIGEN: Strep Pneumo Urinary Antigen: NEGATIVE

## 2020-02-14 LAB — POC SARS CORONAVIRUS 2 AG -  ED: SARS Coronavirus 2 Ag: NEGATIVE

## 2020-02-14 LAB — TSH: TSH: 2.217 u[IU]/mL (ref 0.350–4.500)

## 2020-02-14 MED ORDER — CHLORHEXIDINE GLUCONATE 0.12% ORAL RINSE (MEDLINE KIT)
15.0000 mL | Freq: Two times a day (BID) | OROMUCOSAL | Status: DC
  Administered 2020-02-14 – 2020-02-15 (×2): 15 mL via OROMUCOSAL

## 2020-02-14 MED ORDER — IPRATROPIUM-ALBUTEROL 0.5-2.5 (3) MG/3ML IN SOLN: 3.0000 mL | RESPIRATORY_TRACT | Status: DC

## 2020-02-14 MED ORDER — TOPIRAMATE 25 MG PO TABS: 200.0000 mg | ORAL_TABLET | Freq: Every day | ORAL | Status: DC

## 2020-02-14 MED ORDER — BUDESONIDE 0.5 MG/2ML IN SUSP
0.5000 mg | Freq: Two times a day (BID) | RESPIRATORY_TRACT | Status: DC
  Administered 2020-02-14 – 2020-02-15 (×3): 0.5 mg via RESPIRATORY_TRACT
  Filled 2020-02-14 (×3): qty 2

## 2020-02-14 MED ORDER — NALOXONE HCL 2 MG/2ML IJ SOSY
PREFILLED_SYRINGE | INTRAMUSCULAR | Status: AC
  Administered 2020-02-14: 2 mg via INTRAVENOUS
  Filled 2020-02-14: qty 2

## 2020-02-14 MED ORDER — DEXTROSE 50 % IV SOLN
12.5000 g | INTRAVENOUS | Status: AC
  Administered 2020-02-14: 12.5 g via INTRAVENOUS

## 2020-02-14 MED ORDER — IPRATROPIUM-ALBUTEROL 0.5-2.5 (3) MG/3ML IN SOLN
3.0000 mL | Freq: Four times a day (QID) | RESPIRATORY_TRACT | Status: DC
  Administered 2020-02-14 (×4): 3 mL via RESPIRATORY_TRACT
  Filled 2020-02-14 (×4): qty 3

## 2020-02-14 MED ORDER — CHLORHEXIDINE GLUCONATE CLOTH 2 % EX PADS
6.0000 | MEDICATED_PAD | Freq: Every day | CUTANEOUS | Status: DC
  Administered 2020-02-14 – 2020-02-15 (×2): 6 via TOPICAL

## 2020-02-14 MED ORDER — FENTANYL CITRATE (PF) 100 MCG/2ML IJ SOLN: 50.0000 ug | INTRAMUSCULAR | Status: DC | PRN

## 2020-02-14 MED ORDER — DOCUSATE SODIUM 100 MG PO CAPS: 100.0000 mg | ORAL_CAPSULE | Freq: Two times a day (BID) | ORAL | Status: DC | PRN

## 2020-02-14 MED ORDER — TOPIRAMATE 25 MG PO TABS
200.0000 mg | ORAL_TABLET | Freq: Every day | ORAL | Status: DC
  Administered 2020-02-14: 200 mg
  Filled 2020-02-14: qty 8

## 2020-02-14 MED ORDER — ALBUTEROL SULFATE (2.5 MG/3ML) 0.083% IN NEBU: 2.5000 mg | INHALATION_SOLUTION | Freq: Four times a day (QID) | RESPIRATORY_TRACT | Status: DC | PRN

## 2020-02-14 MED ORDER — DEXTROSE 50 % IV SOLN
INTRAVENOUS | Status: AC
  Filled 2020-02-14: qty 50

## 2020-02-14 MED ORDER — CLONAZEPAM 1 MG PO TABS
1.0000 mg | ORAL_TABLET | Freq: Two times a day (BID) | ORAL | Status: DC
  Administered 2020-02-14 – 2020-02-15 (×3): 1 mg via ORAL
  Filled 2020-02-14 (×3): qty 1

## 2020-02-14 MED ORDER — VANCOMYCIN HCL IN DEXTROSE 1-5 GM/200ML-% IV SOLN
1000.0000 mg | Freq: Once | INTRAVENOUS | Status: DC
  Filled 2020-02-14: qty 200

## 2020-02-14 MED ORDER — ACETAMINOPHEN 325 MG PO TABS: 650.0000 mg | ORAL_TABLET | Freq: Four times a day (QID) | ORAL | Status: DC | PRN

## 2020-02-14 MED ORDER — PANTOPRAZOLE SODIUM 40 MG IV SOLR: 40.0000 mg | Freq: Every day | INTRAVENOUS | Status: DC

## 2020-02-14 MED ORDER — QUETIAPINE FUMARATE 50 MG PO TABS
25.0000 mg | ORAL_TABLET | Freq: Every day | ORAL | Status: DC
  Administered 2020-02-14: 25 mg
  Filled 2020-02-14: qty 1

## 2020-02-14 MED ORDER — FREE WATER
100.0000 mL | Freq: Four times a day (QID) | Status: DC
  Administered 2020-02-14: 100 mL

## 2020-02-14 MED ORDER — SODIUM CHLORIDE 0.9 % IV SOLN
500.0000 mg | Freq: Every day | INTRAVENOUS | Status: DC
  Filled 2020-02-14 (×2): qty 500

## 2020-02-14 MED ORDER — FENTANYL CITRATE (PF) 100 MCG/2ML IJ SOLN
50.0000 ug | INTRAMUSCULAR | Status: DC | PRN
  Administered 2020-02-14 (×2): 50 ug via INTRAVENOUS
  Filled 2020-02-14 (×2): qty 2

## 2020-02-14 MED ORDER — ENOXAPARIN SODIUM 40 MG/0.4ML ~~LOC~~ SOLN: 40.0000 mg | Freq: Every day | SUBCUTANEOUS | Status: DC

## 2020-02-14 MED ORDER — FENTANYL CITRATE (PF) 100 MCG/2ML IJ SOLN
INTRAMUSCULAR | Status: AC
  Administered 2020-02-14: 100 ug
  Filled 2020-02-14: qty 2

## 2020-02-14 MED ORDER — QUETIAPINE FUMARATE 50 MG PO TABS: 50.0000 mg | ORAL_TABLET | Freq: Every day | ORAL | Status: DC

## 2020-02-14 MED ORDER — VITAL AF 1.2 CAL PO LIQD: 1000.0000 mL | ORAL | Status: DC

## 2020-02-14 MED ORDER — METHYLPREDNISOLONE SODIUM SUCC 125 MG IJ SOLR
60.0000 mg | Freq: Four times a day (QID) | INTRAMUSCULAR | Status: DC
  Administered 2020-02-14 – 2020-02-15 (×4): 60 mg via INTRAVENOUS
  Filled 2020-02-14 (×4): qty 2

## 2020-02-14 MED ORDER — NALOXONE HCL 2 MG/2ML IJ SOSY: 2.0000 mg | PREFILLED_SYRINGE | Freq: Once | INTRAMUSCULAR | Status: AC

## 2020-02-14 MED ORDER — ALBUTEROL (5 MG/ML) CONTINUOUS INHALATION SOLN
INHALATION_SOLUTION | RESPIRATORY_TRACT | Status: AC
  Administered 2020-02-14: 20 mg via RESPIRATORY_TRACT
  Filled 2020-02-14: qty 20

## 2020-02-14 MED ORDER — POLYETHYLENE GLYCOL 3350 17 G PO PACK: 17.0000 g | PACK | Freq: Every day | ORAL | Status: DC | PRN

## 2020-02-14 MED ORDER — LACTATED RINGERS IV SOLN: INTRAVENOUS | Status: DC

## 2020-02-14 MED ORDER — POLYETHYLENE GLYCOL 3350 17 G PO PACK: 17.0000 g | PACK | Freq: Every day | ORAL | Status: DC

## 2020-02-14 MED ORDER — VITAL HIGH PROTEIN PO LIQD: 1000.0000 mL | ORAL | Status: DC

## 2020-02-14 MED ORDER — PANTOPRAZOLE SODIUM 40 MG PO PACK
40.0000 mg | PACK | Freq: Every day | ORAL | Status: DC
  Administered 2020-02-15: 40 mg
  Filled 2020-02-14 (×2): qty 20

## 2020-02-14 MED ORDER — SODIUM CHLORIDE 0.9 % IV SOLN
1.0000 g | Freq: Every day | INTRAVENOUS | Status: DC
  Administered 2020-02-14: 1 g via INTRAVENOUS
  Filled 2020-02-14 (×2): qty 10

## 2020-02-14 MED ORDER — DOCUSATE SODIUM 50 MG/5ML PO LIQD
100.0000 mg | Freq: Two times a day (BID) | ORAL | Status: DC
  Filled 2020-02-14: qty 10

## 2020-02-14 MED ORDER — INSULIN ASPART 100 UNIT/ML ~~LOC~~ SOLN
0.0000 [IU] | SUBCUTANEOUS | Status: DC
  Administered 2020-02-14 – 2020-02-15 (×7): 3 [IU] via SUBCUTANEOUS

## 2020-02-14 MED ORDER — SODIUM CHLORIDE 0.9 % IV SOLN
2.0000 g | INTRAVENOUS | Status: DC
  Administered 2020-02-14: 2 g via INTRAVENOUS
  Filled 2020-02-14: qty 2

## 2020-02-14 MED ORDER — MIDAZOLAM HCL 2 MG/2ML IJ SOLN
INTRAMUSCULAR | Status: AC
  Filled 2020-02-14: qty 2

## 2020-02-14 MED ORDER — FENTANYL CITRATE (PF) 100 MCG/2ML IJ SOLN
INTRAMUSCULAR | Status: AC
  Filled 2020-02-14: qty 2

## 2020-02-14 MED ORDER — ALBUTEROL SULFATE (2.5 MG/3ML) 0.083% IN NEBU: 2.5000 mg | INHALATION_SOLUTION | RESPIRATORY_TRACT | Status: DC | PRN

## 2020-02-14 MED ORDER — METHYLPREDNISOLONE SODIUM SUCC 125 MG IJ SOLR: 60.0000 mg | Freq: Four times a day (QID) | INTRAMUSCULAR | Status: DC

## 2020-02-14 MED ORDER — ORAL CARE MOUTH RINSE
15.0000 mL | OROMUCOSAL | Status: DC
  Administered 2020-02-14: 15 mL via OROMUCOSAL

## 2020-02-14 MED ORDER — PRO-STAT SUGAR FREE PO LIQD: 30.0000 mL | Freq: Two times a day (BID) | ORAL | Status: DC

## 2020-02-14 MED ORDER — ENOXAPARIN SODIUM 40 MG/0.4ML ~~LOC~~ SOLN
40.0000 mg | Freq: Every day | SUBCUTANEOUS | Status: DC
  Administered 2020-02-14 – 2020-02-15 (×2): 40 mg via SUBCUTANEOUS
  Filled 2020-02-14 (×2): qty 0.4

## 2020-02-14 MED ORDER — PROPOFOL 1000 MG/100ML IV EMUL
0.0000 ug/kg/min | INTRAVENOUS | Status: DC
  Administered 2020-02-14: 5 ug/kg/min via INTRAVENOUS
  Filled 2020-02-14: qty 100

## 2020-02-14 NOTE — ED Notes (Signed)
Versed 2mg  iv andruw rn and fentavyl  Etomidate 10 mg iv andruw rn

## 2020-02-14 NOTE — Progress Notes (Signed)
Seen and examined. Pressures soft with propofol. Tough to find happy spot between sedation and vent synchrony due to ETT irritation. For this type of patient usually best to just put them on BIPAP. Will monitor closely on BIPAP.  Additional 35 min cc time Myrla Halsted MD PCCM

## 2020-02-14 NOTE — Procedures (Signed)
Intubation Procedure Note  SHARLENA KRISTENSEN  [id number]  1961/06/30  Date:02/14/20  Time:3:02 AM   Provider Performing:Yoshiaki Kreuser    Procedure: Intubation (31500)  Indication(s) Respiratory Failure  Consent Unable to obtain consent due to emergent nature of procedure.   Anesthesia 2 mg versed, 50 mcg fentanyl, 10 mg etomidate, 60 mg rocuronium.   Time Out Verified patient identification, verified procedure, site/side was marked, verified correct patient position, special equipment/implants available, medications/allergies/relevant history reviewed, required imaging and test results available.   Sterile Technique Usual hand hygeine, masks, and gloves were used   Procedure Description Inserted 7.5 ETT to 23 cm at lip without difficulty using video laryngoscope. Confirmed with CO2 detector and ausculation.  Post procedure chest xray pending.  Coralyn Helling, MD [facility] Pulmonary/Critical Care Pager - [phone] 02/14/2020, 3:04 AM

## 2020-02-14 NOTE — ED Notes (Signed)
Roc 60mg  iv per andruw rn

## 2020-02-14 NOTE — ED Provider Notes (Signed)
[facility] EMERGENCY DEPARTMENT Provider Note   CSN: [id number] Arrival date & time: 02/14/20  3435     History Chief Complaint  Patient presents with  . Shortness of Breath    AIGNER HORSEMAN is a 59 y.o. female.  The history is provided by the EMS personnel. The history is limited by the condition of the patient.  Shortness of Breath Severity:  Severe Onset quality:  Sudden Timing:  Constant Progression:  Worsening Chronicity:  Recurrent Context comment:  AMA from [facility] on 6/27 for acute on chronic respiratory failure  Relieved by:  Nothing Worsened by:  Nothing Ineffective treatments: nebs and magnesium and solumedrol en route  Associated symptoms: wheezing   Associated symptoms: no fever   Patient with known COPD on home     Past Medical History:  Diagnosis Date  . Anxiety   . Bipolar 1 disorder, depressed (HCC)   . CAD (coronary artery disease)   . Cataract   . COPD (chronic obstructive pulmonary disease) (HCC)   . Depression   . Headache    chronic  . Hepatitis C    Type II  . Humerus fracture    closed fx- head of left  . Hyperlipidemia   . Hypertension   . IBS (irritable bowel syndrome)   . Migraine   . Neuropathy   . Seasonal allergic rhinitis   . Seizures (HCC)    hx of partial  . Subdural hematoma (HCC)   . Substance abuse (HCC)    drug free x4 mos (cocaine)    Patient Active Problem List   Diagnosis Date Noted  . Medication management 07/28/2019  . IBS (irritable bowel syndrome) 07/28/2019  . Anxiety 07/28/2019  . Chronic respiratory failure with hypoxia (HCC) 06/12/2019  . Former smoker 01/17/2019  . COPD GOLD ?  / group D  01/16/2019    Past Surgical History:  Procedure Laterality Date  . ABDOMINAL HYSTERECTOMY    . APPENDECTOMY    . MULTIPLE TOOTH EXTRACTIONS     full upper and lower dentures  . OTHER SURGICAL HISTORY     hysterectomy     OB History    Gravida  2   Para      Term      Preterm        AB  2   Living        SAB      TAB  2   Ectopic      Multiple      Live Births              Family History  Adopted: Yes    Social History   Tobacco Use  . Smoking status: Former Smoker    Packs/day: 1.00    Years: 44.00    Pack years: 44.00    Types: Cigarettes    Start date: 08/14/1974    Quit date: 06/26/2019    Years since quitting: 0.6  . Smokeless tobacco: Never Used  Substance Use Topics  . Alcohol use: Not Currently  . Drug use: Yes    Types: Cocaine    Comment: recent past- 4 mos ago stopped    Home Medications Prior to Admission medications   Medication Sig Start Date End Date Taking? Authorizing Provider  albuterol (PROVENTIL) (2.5 MG/3ML) 0.083% nebulizer solution use 3 mLs (2.5 mg total) by nebulization every 8 (eight) hours as needed for up to 30 days for Wheezing. 08/18/19   Sandrea Hughs  B, MD  albuterol (VENTOLIN HFA) 108 (90 Base) MCG/ACT inhaler Inhale 2 puffs into the lungs every 6 (six) hours as needed for wheezing or shortness of breath. 10/17/19   Nyoka Cowden, MD  Budeson-Glycopyrrol-Formoterol (BREZTRI AEROSPHERE) 160-9-4.8 MCG/ACT AERO Inhale 2 puffs into the lungs daily. 02/11/20   Glenford Bayley, NP  clonazePAM (KLONOPIN) 1 MG tablet Take 1 mg by mouth 2 (two) times daily.  01/08/18   [provider]  Dextromethorphan-guaiFENesin [facility] DM PO) Take by mouth 2 (two) times daily.    [provider]  Fluticasone-Umeclidin-Vilant (TRELEGY ELLIPTA) 100-62.5-25 MCG/INH AEPB Inhale 1 puff into the lungs daily. 11/27/19   Nyoka Cowden, MD  predniSONE (DELTASONE) 10 MG tablet Take 1 tablet (10 mg total) by mouth daily with breakfast. 02/11/20   Glenford Bayley, NP  Promethazine-DM 6.25-15 MG/5ML SOLN Take 5 mLs by mouth every 6 (six) hours as needed (cough).  01/18/18   [provider]  QUEtiapine (SEROQUEL) 50 MG tablet Take 50 mg by mouth at bedtime.    [provider]  topiramate (TOPAMAX) 100 MG  tablet Take 200 mg by mouth at bedtime.     [provider]    Allergies    Codeine, Levofloxacin, Quetiapine, and Tape  Review of Systems   Review of Systems  Unable to perform ROS: Acuity of condition  Constitutional: Negative for fever.  Respiratory: Positive for shortness of breath and wheezing.     Physical Exam Updated Vital Signs BP (!) 162/84   Pulse 96   Temp (!) 96.2 F (35.7 C) (Temporal)   Resp (!) 28   Ht 5\' 3"  (1.6 m)   Wt 69.9 kg   SpO2 99%   BMI 27.30 kg/m   Physical Exam  ED Results / Procedures / Treatments   Labs (all labs ordered are listed, but only abnormal results are displayed) Labs Reviewed  CBC WITH DIFFERENTIAL/PLATELET - Abnormal; Notable for the following components:      Result Value   WBC 22.1 (*)    HCT 51.4 (*)    MCV 100.8 (*)    MCHC 28.8 (*)    Neutro Abs 18.0 (*)    Monocytes Absolute 1.4 (*)    Abs Immature Granulocytes 0.37 (*)    All other components within normal limits  SARS CORONAVIRUS 2 BY RT PCR (HOSPITAL ORDER, PERFORMED IN [facility])  CULTURE, BLOOD (ROUTINE X 2)  CULTURE, BLOOD (ROUTINE X 2)  COMPREHENSIVE METABOLIC PANEL  BLOOD GAS, ARTERIAL  RAPID URINE DRUG SCREEN, HOSP PERFORMED  POC SARS CORONAVIRUS 2 AG -  ED  TROPONIN I (HIGH SENSITIVITY)    EKG EKG Interpretation  Date/Time:  Saturday February 14 2020 00:43:12 EDT Ventricular Rate:  99 PR Interval:    QRS Duration: 80 QT Interval:  394 QTC Calculation: 506 R Axis:   77 Text Interpretation: Sinus rhythm Ventricular premature complex Borderline T abnormalities, anterior leads Borderline prolonged QT interval Confirmed by Meleny Tregoning ([id number]) on 02/14/2020 1:25:38 AM   Radiology DG Chest Portable 1 View  Result Date: 02/14/2020 CLINICAL DATA:  Increasing difficulty breathing EXAM: PORTABLE CHEST 1 VIEW COMPARISON:  Radiograph 02/06/2020 FINDINGS: Chronic bronchitic features, coarsened interstitial changes and hyperinflation  which are similar to prior. Slightly increased reticular opacity is present in the right mid to lower lung. Pulmonary vascularity remains well distributed. No pneumothorax or visible effusion. Unchanged cardiomediastinal contours. 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  Findings on a background of more diffuse degenerative changes throughout the spine and shoulders including dextrocurvature of the midthoracic spine. Telemetry leads overlie the chest. IMPRESSION: 1. Slightly increased reticular opacity in the right mid to lower lung, could reflect atelectasis or a developing infectious process. 2. 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 3. Chronic bronchitic features, coarsened interstitial changes and hyperinflation which are similar to prior. Electronically Signed   By: Kreg Shropshire M.D.   On: 02/14/2020 01:08    Procedures Procedures (including critical care time)  Medications Ordered in ED Medications  vancomycin (VANCOCIN) IVPB 1000 mg/200 mL premix (has no administration in time range)  ceFEPIme (MAXIPIME) 2 g in sodium chloride 0.9 % 100 mL IVPB (has no administration in time range)  albuterol (VENTOLIN) (5 MG/ML) 0.5% continuous inhalation solution (20 mg Inhalation Given 02/14/20 0107)  naloxone (NARCAN) injection 2 mg (2 mg Intravenous Given 02/14/20 0050)    ED Course  I have reviewed the triage vital signs and the nursing notes.  Pertinent labs & imaging results that were available during my care of the patient were reviewed by me and considered in my medical decision making (see chart for details).    MDM Reviewed: previous chart, nursing note and vitals Reviewed previous: labs Interpretation: labs, ECG and x-ray (no pulmonary edema by me on CXR elevated white count ) Total time providing critical care: 30-74 minutes (bipap initiated by me immediately in the ED). This  excludes time spent performing separately reportable procedures and services. Consults: critical care  CRITICAL CARE Performed by: Onya Eutsler K Claud Gowan-Rasch Total critical care time: 60 minutes Critical care time was exclusive of separately billable procedures and treating other patients. Critical care was necessary to treat or prevent imminent or life-threatening deterioration. Critical care was time spent personally by me on the following activities: development of treatment plan with patient and/or surrogate as well as nursing, discussions with consultants, evaluation of patient's response to treatment, examination of patient, obtaining history from patient or surrogate, ordering and performing treatments and interventions, ordering and review of laboratory studies, ordering and review of radiographic studies, pulse oximetry and re-evaluation of patient's condition.  Final Clinical Impression(s) / ED Diagnoses Final diagnoses:  HCAP (healthcare-associated pneumonia)  Acute on chronic respiratory failure, unspecified whether with hypoxia or hypercapnia (HCC)    Admit to ICU   Loucile Posner, MD 02/14/20 6295

## 2020-02-14 NOTE — Progress Notes (Signed)
Initial Nutrition Assessment  RD working remotely.   DOCUMENTATION CODES:   Not applicable  INTERVENTION:  - will order Vital AF 1.2 @ 45 ml/hr with 100 ml free water QID.  - this regimen + kcal from current propofol rate will provide 1462 kcal, 81 grams protein, and 1276 ml free water.    NUTRITION DIAGNOSIS:   Inadequate oral intake related to inability to eat as evidenced by NPO status.  GOAL:   Patient will meet greater than or equal to 90% of their needs  MONITOR:   Vent status, TF tolerance, Labs, Weight trends  REASON FOR ASSESSMENT:   Ventilator, Consult Enteral/tube feeding initiation and management  ASSESSMENT:   59 yo female with medical hx of severe COPD on home O2. Previous heavy smoker. She presented to the ED due to progressive shortness of breath. She was hypoxic, hypercapnic, and had AMS. She did not improve with BiPAP and was subsequently intubated.  Patient is intubated with OGT in place (gastric; tip at GE junction at time of and xray today at [phone]). Per chart review, weight is 144 lb and weight on 07/28/19 was 154 lb. This indicates 10 lb weight loss (6.5% body weight) in the past 6 months.  Per notes: - COPD exacerbation - RLL CAP - acute metabolic encephalopathy - steroid-induced hyperglycemia   Patient is currently intubated on ventilator support MV: 8.4 L/min Temp (24hrs), Avg:98.1 F (36.7 C), Min:96.2 F (35.7 C), Max:99.3 F (37.4 C) Propofol: 6.29 ml/hr (166 kcal)  Labs reviewed; Cl: 97 mmol/l. Medications reviewed; 100 mg colace BID, sliding scale novolog, 60 mg solu-medrol QID, 17 g miralax/day. IVF; LR @ 50 ml/hr. Drip; propofol @ 15 mcg/kg/min.    NUTRITION - FOCUSED PHYSICAL EXAM:  unable to complete at this time.   Diet Order:   Diet Order            Diet NPO time specified  Diet effective now                 EDUCATION NEEDS:   No education needs have been identified at this time  Skin:  Skin Assessment:  Reviewed RN Assessment  Last BM:  PTA/unknown  Height:   Ht Readings from Last 1 Encounters:  02/14/20 5\' 3"  (1.6 m)    Weight:   Wt Readings from Last 1 Encounters:  02/14/20 65.4 kg     Estimated Nutritional Needs:  Kcal:  1476 kcal Protein:  78-98 grams Fluid:  >/= 1.5 L/day     04/16/20, MS, RD, LDN, CNSC Inpatient Clinical Dietitian RD pager # available in [facility]  After hours/weekend pager # available in [facility]

## 2020-02-14 NOTE — ED Notes (Signed)
Critical care paged  elink person called me back  He does not know this pt    He will etxt the critical care team that intubated this pt

## 2020-02-14 NOTE — Progress Notes (Signed)
eLink Physician-Brief Progress Note Patient Name: Vanessa Haas DOB: May 14, 1961 MRN: [id number]   Date of Service  02/14/2020  HPI/Events of Note  Patient with a history of chronic respiratory failure on BIPAP admitted with acute on chronic hypoxemic / hypercapnic respiratory failure secondary to acute exacerbation of COPD and possible RLL  pneumonia.  eICU Interventions  New Patient Evaluation completed.        Thomasene Lot Essynce Munsch 02/14/2020, 4:53 AM

## 2020-02-14 NOTE — Procedures (Signed)
Extubation Procedure Note  Patient Details:   Name: Vanessa Haas DOB: 1960/12/02 MRN: [id number]   Airway Documentation:    Vent end date: (not recorded) Vent end time: (not recorded)   Evaluation  O2 sats: currently acceptable Complications: No apparent complications Patient did tolerate procedure well. Bilateral Breath Sounds: Diminished   Yes   Patient extubated to bipap per MD order.  Positive cuff leak noted.  Patient able to speak post extubation.  Prior to placing patient on bipap, patient's sats did decrease to low 80s however came back up to 97% on bipap.  No evidence of stridor noted.  No complications noted at this time.  Will continue to monitor.   Elyn Peers 02/14/2020, 9:09 AM

## 2020-02-14 NOTE — ED Notes (Signed)
125mg  solumedrol given approx 3 hours ago by ems  Blood cultures already done c 2 sets cbc approx 2 hours ago

## 2020-02-14 NOTE — ED Notes (Signed)
crticial care here igetting ready to intubate

## 2020-02-14 NOTE — Progress Notes (Signed)
RT note: attempted SBT on patient this AM however patient became apneic and backup ventilation alarmed.  Placed patient back on full support ventilator settings.  Will attempt again when more awake.  Will continue to monitor.

## 2020-02-14 NOTE — H&P (Signed)
NAME:  Vanessa Haas, MRN:  [id number], DOB:  [birth year], LOS: 0 ADMISSION DATE:  02/14/2020, CONSULTATION DATE:  02/14/2020 REFERRING MD:  Dr. Nicanor Alcon, ER, CHIEF COMPLAINT:  Short of breath   Brief History   59 yo female former smoker with severe COPD on home oxygen brought to ER with progressive shortness of breath from COPD exacerbation.  Found to have hypoxia and hypercapnia with altered mental status.  Didn't improve with Bipap and required intubation.  Hx from chart and ER team.  Past Medical History  Cocaine abuse, SDH, Seizures, Allergies, Neuropathy, Migraine HA, IBS, HTN, HLD, Hep C, Depression, CAD, Bipolar, Anxiety,   Significant Hospital Events   7/03 Admit  Consults:    Procedures:  ETT 7/03 >>  Significant Diagnostic Tests:    Micro Data:  SARS CoV2 PCR 7/03 >> negative Blood 7/03 >> Sputum 7/03 >> RVP 7/03 >> Pneumococcal Ag 7/03 >> Legionella Ag 7/03 >>   Antimicrobials:  Rocephin 7/03 >> Zithromax 7/03 >>    Interim history/subjective:    Objective   Blood pressure 126/61, pulse (!) 102, temperature (!) 96.2 F (35.7 C), temperature source Temporal, resp. rate 11, height 5\' 3"  (1.6 m), weight 69.9 kg, SpO2 100 %.    Vent Mode: PRVC FiO2 (%):  [50 %-80 %] 80 % Set Rate:  [15 bmp-16 bmp] 16 bmp Vt Set:  [420 mL] 420 mL PEEP:  [5 cmH20] 5 cmH20 Plateau Pressure:  [29 cmH20] 29 cmH20  No intake or output data in the 24 hours ending 02/14/20 0310 Filed Weights   02/14/20 0119  Weight: 69.9 kg    Examination:  General - obtunded Eyes - pupils reactive ENT - edentulous Cardiac - regular rate/rhythm, no murmur Chest - prolonged exhalation, b/l expiratory wheezing Abdomen - soft, non tender, decreased bowel sounds Extremities - no cyanosis, clubbing, or edema Skin - no rashes Neuro - withdraws to stimuli, not following commands   Resolved Hospital Problem list     Assessment & Plan:   Acute on chronic hypoxic/hypercapnic respiratory  failure from COPD exacerbation and possible Rt lower lobe community acquired pneumonia. - intubate - solumedrol 60 mg q6h - scheduled pulmicort, duoneb and prn albuterol - f/u CXR, ABG - f/u respiratory viral panel, cultures, UDS  Acute metabolic encephalopathy 2nd to hypercapnia. - RASS goal 0 to -1  Steroid induced hyperglycemia. - SSI  Hx of Bipolar, depression, anxiety. - hold outpt klonopin - continue outpt seroquel, topamax  Best practice:  Diet: tube feeds DVT prophylaxis: lovenox GI prophylaxis: protonix Mobility: bed rest Code Status: full code Disposition: ICU  Labs   CBC: Recent Labs  Lab 02/14/20 0042  WBC 22.1*  NEUTROABS 18.0*  HGB 14.8  HCT 51.4*  MCV 100.8*  PLT 279    Basic Metabolic Panel: Recent Labs  Lab 02/14/20 0042  NA 140  K 5.3*  CL 95*  CO2 35*  GLUCOSE 170*  BUN 9  CREATININE 0.79  CALCIUM 9.2   GFR: Estimated Creatinine Clearance: 71 mL/min (by C-G formula based on SCr of 0.79 mg/dL). Recent Labs  Lab 02/14/20 0042  WBC 22.1*    Liver Function Tests: Recent Labs  Lab 02/14/20 0042  AST 31  ALT 23  ALKPHOS 54  BILITOT 1.1  PROT 7.1  ALBUMIN 3.9   No results for input(s): LIPASE, AMYLASE in the last 168 hours. No results for input(s): AMMONIA in the last 168 hours.  ABG    Component Value Date/Time  PHART 7.111 (LL) 02/14/2020 0044   PCO2ART >120 (HH) 02/14/2020 0044   PO2ART PENDING 02/14/2020 0044   HCO3 42.2 (H) 02/14/2020 0044   O2SAT 96.1 02/14/2020 0044     Coagulation Profile: No results for input(s): INR, PROTIME in the last 168 hours.  Cardiac Enzymes: No results for input(s): CKTOTAL, CKMB, CKMBINDEX, TROPONINI in the last 168 hours.  HbA1C: No results found for: HGBA1C  CBG: No results for input(s): GLUCAP in the last 168 hours.  Review of Systems:   Unable to obtain  Past Medical History  She,  has a past medical history of Anxiety, Bipolar 1 disorder, depressed (HCC), CAD  (coronary artery disease), Cataract, COPD (chronic obstructive pulmonary disease) (HCC), Depression, Headache, Hepatitis C, Humerus fracture, Hyperlipidemia, Hypertension, IBS (irritable bowel syndrome), Migraine, Neuropathy, Seasonal allergic rhinitis, Seizures (HCC), Subdural hematoma (HCC), and Substance abuse (HCC).   Surgical History    Past Surgical History:  Procedure Laterality Date  . ABDOMINAL HYSTERECTOMY    . APPENDECTOMY    . MULTIPLE TOOTH EXTRACTIONS     full upper and lower dentures  . OTHER SURGICAL HISTORY     hysterectomy     Social History   reports that she quit smoking about 7 months ago. Her smoking use included cigarettes. She started smoking about 45 years ago. She has a 44.00 pack-year smoking history. She has never used smokeless tobacco. She reports previous alcohol use. She reports current drug use. Drug: Cocaine.   Family History   Her family history is not on file. She was adopted.   Allergies Allergies  Allergen Reactions  . Codeine Itching and Rash  . Levofloxacin Nausea And Vomiting    Patient states that it is too strong, will take if she has too. Can hurt kidneys   . Quetiapine Other (See Comments)    Medication is too strong for patient. It knocks her out.  . Tape Rash    Blisters skin - paper tape is ok     Home Medications  Prior to Admission medications   Medication Sig Start Date End Date Taking? Authorizing Provider  albuterol (PROVENTIL) (2.5 MG/3ML) 0.083% nebulizer solution use 3 mLs (2.5 mg total) by nebulization every 8 (eight) hours as needed for up to 30 days for Wheezing. 08/18/19   Nyoka Cowden, MD  albuterol (VENTOLIN HFA) 108 (90 Base) MCG/ACT inhaler Inhale 2 puffs into the lungs every 6 (six) hours as needed for wheezing or shortness of breath. 10/17/19   Nyoka Cowden, MD  Budeson-Glycopyrrol-Formoterol (BREZTRI AEROSPHERE) 160-9-4.8 MCG/ACT AERO Inhale 2 puffs into the lungs daily. 02/11/20   Glenford Bayley, NP    clonazePAM (KLONOPIN) 1 MG tablet Take 1 mg by mouth 2 (two) times daily.  01/08/18   [provider]  Dextromethorphan-guaiFENesin [employer] DM PO) Take by mouth 2 (two) times daily.    [provider]  Fluticasone-Umeclidin-Vilant (TRELEGY ELLIPTA) 100-62.5-25 MCG/INH AEPB Inhale 1 puff into the lungs daily. 11/27/19   Nyoka Cowden, MD  predniSONE (DELTASONE) 10 MG tablet Take 1 tablet (10 mg total) by mouth daily with breakfast. 02/11/20   Glenford Bayley, NP  Promethazine-DM 6.25-15 MG/5ML SOLN Take 5 mLs by mouth every 6 (six) hours as needed (cough).  01/18/18   [provider]  QUEtiapine (SEROQUEL) 50 MG tablet Take 50 mg by mouth at bedtime.    [provider]  topiramate (TOPAMAX) 100 MG tablet Take 200 mg by mouth at bedtime.  [provider]     Critical care time: 43 minutes independent of procedure time.  Coralyn Helling, MD [facility] Pulmonary/Critical Care Pager - [phone] 02/14/2020, 3:23 AM

## 2020-02-14 NOTE — ED Notes (Signed)
Intubated by critical care md  22 at  [facility]

## 2020-02-14 NOTE — Progress Notes (Signed)
Patient taken off of bipap and placed on 6L [place] high flow cannula.

## 2020-02-14 NOTE — ED Notes (Signed)
Report to nate

## 2020-02-14 NOTE — Progress Notes (Signed)
Pt placed on Servo I NIV/BIPAP for the night. Pt respiratory status is stable at this time. RT will continue to monitor.

## 2020-02-14 NOTE — Progress Notes (Signed)
Pt transported from ED TRAC to 2M06 with no complications.

## 2020-02-14 NOTE — ED Triage Notes (Signed)
The pt has had the mederna vaccine both

## 2020-02-14 NOTE — ED Triage Notes (Signed)
The pt arrived by gems from home  Difficulty breaTHING ALL DAY MORE THAN Usual she is on home 02 at 4 liters  She also had low sats at home  She arrived on a non-rebreather  She also had taken a sleeping pill

## 2020-02-14 NOTE — Progress Notes (Signed)
eLink Physician-Brief Progress Note Patient Name: Vanessa Haas DOB: 03/24/1961 MRN: [id number]   Date of Service  02/14/2020  HPI/Events of Note  Patient needs mechanical ventilation sedation orders.  eICU Interventions  Orders entered.        Thomasene Lot [name] 02/14/2020, 3:46 AM

## 2020-02-15 ENCOUNTER — Telehealth: Payer: Self-pay | Admitting: Acute Care

## 2020-02-15 ENCOUNTER — Other Ambulatory Visit: Payer: Self-pay

## 2020-02-15 ENCOUNTER — Inpatient Hospital Stay (HOSPITAL_COMMUNITY): Payer: Medicaid Other

## 2020-02-15 DIAGNOSIS — J441 Chronic obstructive pulmonary disease with (acute) exacerbation: Principal | ICD-10-CM

## 2020-02-15 LAB — BASIC METABOLIC PANEL
Anion gap: 5 (ref 5–15)
BUN: 16 mg/dL (ref 6–20)
CO2: 36 mmol/L — ABNORMAL HIGH (ref 22–32)
Calcium: 8.9 mg/dL (ref 8.9–10.3)
Chloride: 95 mmol/L — ABNORMAL LOW (ref 98–111)
Creatinine, Ser: 0.69 mg/dL (ref 0.44–1.00)
GFR calc Af Amer: >60 mL/min (ref >60)
GFR calc non Af Amer: >60 mL/min (ref >60)
Glucose, Bld: 101 mg/dL — ABNORMAL HIGH (ref 70–99)
Potassium: 4.4 mmol/L (ref 3.5–5.1)
Sodium: 136 mmol/L (ref 135–145)

## 2020-02-15 LAB — CBC
HCT: 39 % (ref 36.0–46.0)
Hemoglobin: 12.2 g/dL (ref 12.0–15.0)
MCH: 29.8 pg (ref 26.0–34.0)
MCHC: 31.3 g/dL (ref 30.0–36.0)
MCV: 95.1 fL (ref 80.0–100.0)
Platelets: 136 10*3/uL — ABNORMAL LOW (ref 150–400)
RBC: 4.1 MIL/uL (ref 3.87–5.11)
RDW: 12.9 % (ref 11.5–15.5)
WBC: 9.8 10*3/uL (ref 4.0–10.5)
nRBC: 0 % (ref 0.0–0.2)

## 2020-02-15 LAB — MAGNESIUM: Magnesium: 1.9 mg/dL (ref 1.7–2.4)

## 2020-02-15 LAB — GLUCOSE, CAPILLARY
Glucose-Capillary: 96 mg/dL (ref 70–99)
Glucose-Capillary: 122 mg/dL — ABNORMAL HIGH (ref 70–99)
Glucose-Capillary: 134 mg/dL — ABNORMAL HIGH (ref 70–99)

## 2020-02-15 LAB — TRIGLYCERIDES: Triglycerides: 128 mg/dL (ref <150)

## 2020-02-15 LAB — PHOSPHORUS: Phosphorus: 1.5 mg/dL — ABNORMAL LOW (ref 2.5–4.6)

## 2020-02-15 MED ORDER — MAGNESIUM SULFATE 2 GM/50ML IV SOLN
2.0000 g | Freq: Once | INTRAVENOUS | Status: AC
  Administered 2020-02-15: 2 g via INTRAVENOUS
  Filled 2020-02-15: qty 50

## 2020-02-15 MED ORDER — QUETIAPINE FUMARATE 50 MG PO TABS: 50.0000 mg | ORAL_TABLET | Freq: Every day | ORAL | Status: DC

## 2020-02-15 MED ORDER — SODIUM PHOSPHATES 45 MMOLE/15ML IV SOLN
45.0000 mmol | Freq: Once | INTRAVENOUS | Status: AC
  Administered 2020-02-15: 45 mmol via INTRAVENOUS
  Filled 2020-02-15: qty 15

## 2020-02-15 MED ORDER — FLUTICASONE FUROATE-VILANTEROL 100-25 MCG/INH IN AEPB
1.0000 | INHALATION_SPRAY | Freq: Every day | RESPIRATORY_TRACT | Status: DC
  Filled 2020-02-15: qty 28

## 2020-02-15 MED ORDER — TOPIRAMATE 25 MG PO TABS: 100.0000 mg | ORAL_TABLET | Freq: Every day | ORAL | Status: DC

## 2020-02-15 MED ORDER — TIOTROPIUM BROMIDE MONOHYDRATE 18 MCG IN CAPS
18.0000 ug | ORAL_CAPSULE | Freq: Every day | RESPIRATORY_TRACT | Status: DC
  Administered 2020-02-15: 18 ug via RESPIRATORY_TRACT
  Filled 2020-02-15: qty 5

## 2020-02-15 MED ORDER — AZITHROMYCIN 500 MG PO TABS: 500.0000 mg | ORAL_TABLET | Freq: Every day | ORAL | 0 refills | Status: AC

## 2020-02-15 MED ORDER — PREDNISONE 20 MG PO TABS: 40.0000 mg | ORAL_TABLET | Freq: Every day | ORAL | 0 refills | Status: AC

## 2020-02-15 MED ORDER — AZITHROMYCIN 500 MG PO TABS
500.0000 mg | ORAL_TABLET | Freq: Every day | ORAL | Status: DC
  Administered 2020-02-15: 500 mg via ORAL
  Filled 2020-02-15: qty 1

## 2020-02-15 MED ORDER — PREDNISONE 10 MG PO TABS
40.0000 mg | ORAL_TABLET | Freq: Every day | ORAL | Status: DC
  Administered 2020-02-15: 40 mg via ORAL
  Filled 2020-02-15: qty 4

## 2020-02-15 MED ORDER — MOMETASONE FURO-FORMOTEROL FUM 200-5 MCG/ACT IN AERO
2.0000 | INHALATION_SPRAY | Freq: Two times a day (BID) | RESPIRATORY_TRACT | Status: DC
  Administered 2020-02-15: 2 via RESPIRATORY_TRACT
  Filled 2020-02-15: qty 8.8

## 2020-02-15 MED ORDER — UMECLIDINIUM BROMIDE 62.5 MCG/INH IN AEPB
1.0000 | INHALATION_SPRAY | Freq: Every day | RESPIRATORY_TRACT | Status: DC
  Filled 2020-02-15: qty 7

## 2020-02-15 NOTE — Discharge Summary (Signed)
Physician Discharge Summary  Patient ID: NEL STONEKING MRN: [id number] DOB/AGE: [birth year] 59 y.o.  Admit date: 02/14/2020 Discharge date: 02/15/2020    Discharge Diagnoses:  Acute on chronic hypoxic/hypercapnic respiratory failure from COPD Possible Rt lower lobe community acquired pneumonia. Anxiety, benzodiazepine dependence, THC in urine                     DISCHARGE PLAN BY DIAGNOSIS    Acute on chronic hypoxic/hypercapnic respiratory failure from COPD exacerbation  Possible Rt lower lobe community acquired pneumonia. Plan: - Pct neg, will plan to complete 5 days of azithromycin - Switch to PO prednisone and spiriva/dulera (not tolerating trelegy per latest note and switched to breztri) -Will need to follow up with Dr, Melvyn Novas 2-3 weeks from discharge   HX of Bipolar and Anxiety Benzodiazepine dependence THC in urine -Probably contributing to current presentation Plan: Educated on importance of cessation                    DISCHARGE SUMMARY   59 yo female former smoker with severe COPD on home oxygen brought to ER with progressive shortness of breath from COPD exacerbation.  Found to have hypoxia and hypercapnia with altered mental status.  Didn't improve with Bipap and required intubation overnight 7/3. Morning of 7/3 attempted SBT but patient was able to support respirations due to apnea. Vent setting were adjusted and patient tolerated well. She was able to tolerated extubated by mid morning of 7/3. During hospitalization patient received respiratory support via mechanical ventilation, IV steroids, IV antibiotics, and bronchodilators with significant improvement in clinical status seen. By morning of 7/4 patient remains alert and oriented, hemodynamically stable, and appears to be back at baseline therefore she will be discharged home. She will be provided steroids, bronchodilators, and PO antibiotics at discharge.             SIGNIFICANT DIAGNOSTIC STUDIES CXR 7/3 >  Chronic interstitial changes and hyperinflation.  MICRO DATA  SARS CoV2 PCR 7/03 >> negative Blood 7/03 >> no growth to date  Sputum 7/03 >>Pending  RVP 7/03 >>Pending  Pneumococcal Ag 7/03 >> Negative  Legionella Ag 7/03 >> Pending   ANTIBIOTICS Azithromycin  Ceftriaxone 7/3-7/3 Vancomycin x1  CONSULTS None  TUBES / LINES ETT 7/3-7/3  Discharge Exam: Per attending assessment  GEN: anxious woman in NAD HEENT: MMM, trachea midline CV: RRR, ext warm PULM: Severely diminished BL GI: Soft, +BS EXT: No edema NEURO: Moves all 4 ext to command, tremulous PSYCH: Agitated but AOx3 SKIN: No rashes  Vitals:   02/15/20 0945 02/15/20 1000 02/15/20 1015 02/15/20 1100  BP: (!) 147/83 139/68 (!) 161/72 140/76  Pulse: 100 94 (!) 104 (!) 102  Resp: '18 16 16 18  '$ Temp:    98.9 F (37.2 C)  TempSrc:    Oral  SpO2: 96% 98% 91% 97%  Weight:      Height:         Discharge Labs  BMET Recent Labs  Lab 02/14/20 0042 02/14/20 0042 02/14/20 0354 02/14/20 1703 02/15/20 0237  NA 140  --  141  --  136  K 5.3*   < > 4.6  --  4.4  CL 95*  --  97*  --  95*  CO2 35*  --  32  --  36*  GLUCOSE 170*  --  175*  --  101*  BUN 9  --  9  --  16  CREATININE 0.79  --  0.75  --  0.69  CALCIUM 9.2  --  9.0  --  8.9  MG  --   --  2.4 2.1 1.9  PHOS  --   --  3.1 2.5 1.5*   < > = values in this interval not displayed.    CBC Recent Labs  Lab 02/14/20 0042 02/14/20 0354 02/15/20 0237  HGB 14.8 14.4 12.2  HCT 51.4* 47.9* 39.0  WBC 22.1* 15.3* 9.8  PLT 279 147* 136*    Anti-Coagulation No results for input(s): INR in the last 168 hours.    Allergies as of 02/15/2020      Reactions   Codeine Itching, Rash   Levofloxacin Nausea And Vomiting   Patient states that it is too strong, will take if she has too. Can hurt kidneys    Quetiapine Other (See Comments)   Medication is too strong for patient. It knocks her out.   Tape Rash   Blisters skin - paper tape is ok      Medication  List    STOP taking these medications   Trelegy Ellipta 100-62.5-25 MCG/INH Aepb Generic drug: Fluticasone-Umeclidin-Vilant     TAKE these medications   albuterol (2.5 MG/3ML) 0.083% nebulizer solution Commonly known as: PROVENTIL use 3 mLs (2.5 mg total) by nebulization every 8 (eight) hours as needed for up to 30 days for Wheezing.   albuterol 108 (90 Base) MCG/ACT inhaler Commonly known as: VENTOLIN HFA Inhale 2 puffs into the lungs every 6 (six) hours as needed for wheezing or shortness of breath.   azithromycin 500 MG tablet Commonly known as: ZITHROMAX Take 1 tablet (500 mg total) by mouth daily. Start taking on: February 16, 2020   Breztri Aerosphere 160-9-4.8 MCG/ACT Aero Generic drug: Budeson-Glycopyrrol-Formoterol Inhale 2 puffs into the lungs daily.   citalopram 40 MG tablet Commonly known as: CELEXA Take 40 mg by mouth daily.   clonazePAM 1 MG tablet Commonly known as: KLONOPIN Take 1 mg by mouth 2 (two) times daily.   MUCINEX DM PO Take 5 mLs by mouth 2 (two) times daily as needed (For cold symptoms).   predniSONE 20 MG tablet Commonly known as: DELTASONE Take 2 tablets (40 mg total) by mouth daily with breakfast for 7 days. Start taking on: February 16, 2020 What changed:   medication strength  how much to take   Promethazine-DM 6.25-15 MG/5ML Soln Take 5 mLs by mouth every 6 (six) hours as needed (cough).   QUEtiapine 50 MG tablet Commonly known as: SEROQUEL Take 50 mg by mouth at bedtime.   topiramate 100 MG tablet Commonly known as: TOPAMAX Take 100 mg by mouth at bedtime.       Disposition: Home, self care   Discharged Condition: VICTORA IRBY has met maximum benefit of inpatient care and is medically stable and cleared for discharge.  Patient is pending follow up as above.     Time spent on disposition:  30 Minutes.   Signed: Johnsie Cancel, NP-C [facility] Contact / Pager information can be found on Amion   02/15/2020, 11:13 AM

## 2020-02-15 NOTE — Progress Notes (Signed)
.  The [facility] ADULT ICU REPLACEMENT PROTOCOL   The patient does apply for the [facility] Adult ICU Electrolyte Replacment Protocol based on the criteria listed below:   1. Is GFR >/= 30 ml/min? Yes.    Patient's GFR today is >60 2. Is SCr </= 2? Yes.   Patient's SCr is 0.69 ml/kg/hr 3. Did SCr increase >/= 0.5 in 24 hours? 4. Abnormal electrolyte(s): Mag 1.9 Phos 1.5 5. Ordered repletion with: protocol 6. If a panic level lab has been reported, has the CCM MD in charge been notified? Yes.  .   Physician:  Lebron Conners 02/15/2020 6:07 AM

## 2020-02-15 NOTE — Progress Notes (Signed)
Assisted patient with ambulation around the unit, approximately 150 ft, on 2L [facility], patient's oxygen saturation maintained above 90% with no signs or complaints of respiratory distress.

## 2020-02-15 NOTE — Progress Notes (Signed)
   NAME:  Vanessa Haas, MRN:  [id number], DOB:  [birth year], LOS: 1 ADMISSION DATE:  02/14/2020, CONSULTATION DATE:  02/14/2020 REFERRING MD:  Dr. Nicanor Alcon, ER, CHIEF COMPLAINT:  Short of breath   Brief History   59 yo female former smoker with severe COPD on home oxygen brought to ER with progressive shortness of breath from COPD exacerbation.  Found to have hypoxia and hypercapnia with altered mental status.  Didn't improve with Bipap and required intubation.  Hx from chart and ER team.  Past Medical History  Cocaine abuse, SDH, Seizures, Allergies, Neuropathy, Migraine HA, IBS, HTN, HLD, Hep C, Depression, CAD, Bipolar, Anxiety,   Significant Hospital Events   7/03 Admit  Consults:    Procedures:  ETT 7/03 >>  Significant Diagnostic Tests:    Micro Data:  SARS CoV2 PCR 7/03 >> negative Blood 7/03 >> Sputum 7/03 >> RVP 7/03 >> Pneumococcal Ag 7/03 >> Legionella Ag 7/03 >>   Antimicrobials:  Rocephin 7/03 >> Zithromax 7/03 >>    Interim history/subjective:  Extubated without issue. Did not wear bipap. Wants to go home.  Objective   Blood pressure 134/78, pulse 91, temperature 98.9 F (37.2 C), temperature source Oral, resp. rate 18, height 5\' 3"  (1.6 m), weight 65.4 kg, SpO2 99 %.    Vent Mode: BIPAP;PCV FiO2 (%):  [30 %-40 %] 40 % Set Rate:  [12 bmp-18 bmp] 12 bmp Vt Set:  [420 mL] 420 mL PEEP:  [5 cmH20-6 cmH20] 6 cmH20 Plateau Pressure:  [21 cmH20] 21 cmH20   Intake/Output Summary (Last 24 hours) at 02/15/2020 04/17/2020 Last data filed at 02/15/2020 0500 Gross per 24 hour  Intake 443.14 ml  Output 1025 ml  Net -581.86 ml   Filed Weights   02/14/20 0119 02/14/20 0445  Weight: 69.9 kg 65.4 kg    Examination: GEN: anxious woman in NAD HEENT: MMM, trachea midline CV: RRR, ext warm PULM: Severely diminished BL GI: Soft, +BS EXT: No edema NEURO: Moves all 4 ext to command, tremulous PSYCH: Agitated but AOx3 SKIN: No rashes  Phos and mag low being  repleted  Resolved Hospital Problem list     Assessment & Plan:   Acute on chronic hypoxic/hypercapnic respiratory failure from COPD exacerbation and possible Rt lower lobe community acquired pneumonia. - Pct neg, would stick with azithromycin - Switch to PO prednisone and spiriva/dulera (not tolerating trelegy per latest note and switched to breztri) - See if she can walk around unit, if so potentially home today otherwise may need a couple days, discussed with patient at length  Anxiety, benzodiazepine dependence, THC in urine- probably contributing to current presentation  04/16/20 MD PCCM

## 2020-02-15 NOTE — Plan of Care (Signed)

## 2020-02-15 NOTE — Progress Notes (Signed)
Patient discharged with cellphone, clothing, personal picture frame and discharge instructions. Assisted to car in wheelchair by NT.

## 2020-02-15 NOTE — Telephone Encounter (Signed)
Please set up follow up apt with NP or Dr. Sherene Sires at the end of this week vs next week. Thanks!

## 2020-02-16 LAB — LEGIONELLA PNEUMOPHILA SEROGP 1 UR AG: L. pneumophila Serogp 1 Ur Ag: NEGATIVE

## 2020-02-17 NOTE — Telephone Encounter (Signed)
Spoke with patient, she states she was in [facility] and she saw Dr. Katrinka Blazing while inpatient.  She was d/c'd on 7/5 and has picked up her d/c meds.  No appointments available prior to her scheduled appointment on 7/21 at 10 am with BM.  Patient advised to call us with any s/s.  She verbalized understanding.  Nothing further needed.

## 2020-02-19 LAB — CULTURE, BLOOD (ROUTINE X 2)
Culture: NO GROWTH
Culture: NO GROWTH
Special Requests: ADEQUATE

## 2020-03-03 ENCOUNTER — Ambulatory Visit: Payer: Medicaid Other | Admitting: Primary Care

## 2020-04-15 ENCOUNTER — Telehealth: Payer: Self-pay | Admitting: Internal Medicine

## 2020-04-15 NOTE — Telephone Encounter (Signed)
Called and spoke with Patient.  Patient stated she was having BP issues while taking Breztri, and thought she needed to go back to Trelegy.  Patient stated she scheduled OV with Dr Sherene Sires 04/16/20, and was going to discuss BP issues and inhalers during that time. Nothing further needed at this time.

## 2020-04-16 ENCOUNTER — Telehealth: Payer: Self-pay | Admitting: Internal Medicine

## 2020-04-16 ENCOUNTER — Ambulatory Visit: Payer: Medicaid Other | Admitting: Internal Medicine

## 2020-04-16 NOTE — Telephone Encounter (Signed)
Called and spoke with pt. Pt is requesting to switch back to the Trelegy Ellipta as she states that the [place] has been elevating her BP. Dr. Sherene Sires, please advise if you are okay with this being done.

## 2020-04-17 NOTE — Telephone Encounter (Signed)
Fine with me but only do one month and ov before gets a refill so we can be sure it's the right med long term   rx is Trelegy 100

## 2020-04-20 MED ORDER — TRELEGY ELLIPTA 100-62.5-25 MCG/INH IN AEPB
1.0000 | INHALATION_SPRAY | Freq: Every day | RESPIRATORY_TRACT | 0 refills | Status: DC
Start: 2020-04-20 — End: 2020-05-25

## 2020-04-20 NOTE — Telephone Encounter (Signed)
Attempted to call pt but unable to reach. Left message for her to return call. 

## 2020-04-20 NOTE — Telephone Encounter (Signed)
Called and spoke with pt letting her know that we were sending Rx for trelegy to pharmacy for her and stated to her at upcoming OV with MW, he would reassess then. Pt verbalized understanding. Verified preferred pharmacy and sent Rx in for pt. Nothing further needed.

## 2020-04-30 ENCOUNTER — Telehealth: Payer: Self-pay | Admitting: Internal Medicine

## 2020-04-30 NOTE — Telephone Encounter (Signed)
Attempted to call pt but unable to reach. Left message for her to return call. 

## 2020-05-04 NOTE — Telephone Encounter (Signed)
lmtcb for pt.  

## 2020-05-05 ENCOUNTER — Telehealth: Payer: Self-pay | Admitting: Internal Medicine

## 2020-05-05 DIAGNOSIS — J449 Chronic obstructive pulmonary disease, unspecified: Secondary | ICD-10-CM

## 2020-05-05 MED ORDER — PREDNISONE 10 MG PO TABS: 10.0000 mg | ORAL_TABLET | Freq: Every day | ORAL | 0 refills | Status: AC

## 2020-05-05 MED ORDER — ALBUTEROL SULFATE HFA 108 (90 BASE) MCG/ACT IN AERS: 2.0000 | INHALATION_SPRAY | Freq: Four times a day (QID) | RESPIRATORY_TRACT | 2 refills | Status: DC | PRN

## 2020-05-05 NOTE — Telephone Encounter (Signed)
Spoke with the pt and refilled pred and ventolin per her request Nothing further needed

## 2020-05-05 NOTE — Telephone Encounter (Signed)
Attempted to call pt but line went straight to VM. Left message for her to return call. Due to multiple attempts trying to reach pt and unable to do so, per triage protocol encounter will be closed. 

## 2020-05-06 ENCOUNTER — Telehealth: Payer: Self-pay | Admitting: Internal Medicine

## 2020-05-06 ENCOUNTER — Ambulatory Visit: Payer: Medicaid Other | Attending: Internal Medicine | Admitting: Physical Therapy

## 2020-05-06 NOTE — Telephone Encounter (Signed)
Called and spoke with patient, she stated that the pharmacy did not get the prescription for Albuterol.  The pharmacy did receive the script, I called the pharmacy and it is not due for refill until 05/17/2020, pharmacy will call patient and make her aware.  Nothing further needed.

## 2020-05-20 ENCOUNTER — Ambulatory Visit: Payer: Medicaid Other | Admitting: Physical Therapy

## 2020-05-25 ENCOUNTER — Other Ambulatory Visit: Payer: Self-pay | Admitting: Internal Medicine

## 2020-05-26 ENCOUNTER — Ambulatory Visit (INDEPENDENT_AMBULATORY_CARE_PROVIDER_SITE_OTHER): Payer: Medicaid Other | Admitting: Internal Medicine

## 2020-05-26 ENCOUNTER — Encounter: Payer: Self-pay | Admitting: Internal Medicine

## 2020-05-26 ENCOUNTER — Telehealth: Payer: Self-pay | Admitting: Internal Medicine

## 2020-05-26 ENCOUNTER — Other Ambulatory Visit: Payer: Self-pay

## 2020-05-26 ENCOUNTER — Ambulatory Visit: Payer: Medicaid Other | Admitting: Internal Medicine

## 2020-05-26 DIAGNOSIS — J449 Chronic obstructive pulmonary disease, unspecified: Secondary | ICD-10-CM

## 2020-05-26 DIAGNOSIS — J9611 Chronic respiratory failure with hypoxia: Secondary | ICD-10-CM | POA: Diagnosis not present

## 2020-05-26 NOTE — Assessment & Plan Note (Signed)
Active smoker - 01/16/2019  After extensive coaching inhaler device,  effectiveness =    90% with dpi, 50% with hfa    Group D in terms of symptom/risk and laba/lama/ICS  therefore appropriate rx at this point >>>  Continue trelegy and approp saba  I spent extra time with pt today reviewing appropriate use of albuterol for prn use on exertion with the following points: 1) saba is for relief of sob that does not improve by walking a slower pace or resting but rather if the pt does not improve after trying this first. 2) If the pt is convinced, as many are, that saba helps recover from activity faster then it's easy to tell if this is the case by re-challenging : ie stop, take the inhaler, then p 5 minutes try the exact same activity (intensity of workload) that just caused the symptoms and see if they are substantially diminished or not after saba 3) if there is an activity that reproducibly causes the symptoms, try the saba 15 min before the activity on alternate days   If in fact the saba really does help, then fine to continue to use it prn but advised may need to look closer at the maintenance regimen being used to achieve better control of airways disease with exertion.

## 2020-05-26 NOTE — Patient Instructions (Signed)
No change in medications   Please schedule a follow up office visit in 4 weeks, sooner if needed  with all medications /inhalers/ solutions in hand so we can verify exactly what you are taking. This includes all medications from all doctors and over the counters   

## 2020-05-26 NOTE — Progress Notes (Signed)
Vanessa Haas, female    DOB: [birth year]     MRN: [id number]   Brief patient profile:  32 yowf active smoker  With onset sob/cough around 2012 and progressively worse  on multiple (TNTC) inhalers/nebs since and noct 02 x 2018 so referred to pulmonary clinic 01/16/2019 by Norva Riffle      History of Present Illness  01/16/2019  Pulmonary/ 1st office eval/Kayonna Lawniczak  Chief Complaint  Patient presents with  . Consult    Consult YS:AYTK. States her oxygen drops to 63. She usually wears 2L. He states if he keeps the house temp cooler it helps with her breathing. Smokes 4 cigarettes per day.   Dyspnea:  Mostly rides cart when shops / doe x across the room, better with 02  Cough: "some but it's allergies" worse in ams better p neb/ min mucoid prod Sleep: disrupted at least once - twice noct and needs saba  SABA ZSW:FUXNATF noted   rec Plan A = Automatic = Trelegy take two good drags - hold with right hand and hinge open  Plan B = Backup Only use your albuterol inhaler as a rescue medication Plan C = Crisis - only use your albuterol-ipatropium (prefer pure albuterol)  nebulizer if you first try Plan B and it fails to help > ok to use the nebulizer up to every 4 hours but if start needing it regularly call for immediate appointment For cough > mucinex dm up to1200 mg every 12 hours as needed  For nasal drainage/ throat irritation "allergy"  > zyrtec 10 mg daily as needed      Virtual Visit via Telephone Note 02/19/2019   I connected with Vanessa Haas on 02/19/19 at 10:45 AM EDT by telephone and verified that I am speaking with the correct person using two identifiers.   Pt is at home and this call was placed from my office with no other participants on the call.   I discussed the limitations, risks, security and privacy concerns of performing an evaluation and management service by telephone and the availability of in person appointments. I also discussed with the patient that there  may be a patient responsible charge related to this service. The patient expressed understanding and agreed to proceed.   History of Present Illness: copd gold ? / still smoking   Dyspnea:  Still room to room  Cough: only in afternoons Sleeping: variably am congestion/ no purulent sputum   SABA use: a couple of times a day 02: has portable and machine not using either Called in for pred prn from pcp  rec No change in medications for  Now The key is to stop smoking completely before smoking completely stops you! Please schedule a follow up office visit in 6 weeks, call sooner if needed with all medications /inhalers/ solutions in hand so we can verify exactly what you are taking. This includes all medications from all doctors and over the counters    Virtual Visit via Telephone Note 04/03/2019 History of Present Illness: copd ? Gold on trelegy/ still smoking Dyspnea:  Improved vs last visit Cough: better if stays inside/ no am  Sleeping: better  SABA use: maybe once a day hfa if stays inside, apparently still using duoneb twice daily for reasons that aren't clear  rec Plan A = Automatic = trelegy one click each am  Plan B = Backup Only use your albuterol inhaler as a rescue medication  Plan C = Crisis - only use your albuterol  nebulizer (NOT duoneb=  combined with ipatropium)  if you first try Plan B and it fails to help > ok to use the nebulizer up to every 4 hours but if start needing it regularly call for immediate appointment   Virtual Visit via Telephone Note 06/12/2019   I connected with Vanessa Haas on 06/12/19 at   815 AM EDT by telephone and verified that I am speaking with the correct person using two identifiers.   I discussed the limitations, risks, security and privacy concerns of performing an evaluation and management service by telephone and the availability of in person appointments. I also discussed with the patient that there may be a patient responsible charge  related to this service. The patient expressed understanding and agreed to proceed.   History of Present Illness: still smoking but less  Dyspnea:  Was better until ran out of trelegy around the 1st of Oct > aecopd presently on pred/amox Cough: more since stopped trelegy> clear mucus Sleeping: once or twice needs neb while flat, 2 pillows  SABA use: one twice daytime daily when active  02: 2 lpm hs, as needed during the day  Observations/Objective: 97% at rest per husband  Sounds fine on the phone at rest s rattling cough/ good phonatin  rec 02 should be 2lpm at bedtime and goal is to keep it above 90% during the day with activity and  at rest by adjusting your flow rate  The key is to stop smoking completely before smoking completely stops you! You can use albuterol up to every 4 hours as needed  - first try the inhaler and then if not relieved at rest, use the nebulizer next (wait 15 minutes after the inhalers first to see if it helped)   Virtual Visit via Telephone Note 08/18/2019   I connected with Vanessa Haas on 08/18/19 at  9:45 AM EST by telephone and verified that I am speaking with the correct person using two identifiers.   I discussed the limitations, risks, security and privacy concerns of performing an evaluation and management service by telephone and the availability of in person appointments. I also discussed with the patient that there may be a patient responsible charge related to this service. The patient expressed understanding and agreed to proceed.   History of Present Illness: copd / last smoked around Jul 15 2019 Dyspnea:  Room to room / worse since last pred rx 3 weeks prior  Cough: worse pnds/ assoc nasal congestion but no purulent secretions/ also improved with pred SABA use: bid neb / increased since off pred 02: ok at rest / 2lpm at hs, prn daytime to keep sats > 90%  rec For drippy nose > zyrtec 10 mg daily as needed  For stuffy nose > zyrtec D one  daily as needed > if not improving you will need to see ENT doctor- call for referral  Blow trelegy out thru nose to get the benefit of the prednisone in the inhaler Prednisone 10 mg take  4 each am x 2 days,   2 each am x 2 days,  1 each am x 2 days and stop  Continue to adjust your daytime oyxgen to keep the saturations above 90%  Ok to reschedule your lung cancer screening CT for 3 months to avoid exposure to COVID 19 If not better by 2 weeks return with all your medications in hand - otherwise we can see you in 3 months   Acute Virtual Visit  via Telephone Note 11/27/2019  History of Present Illness:  Back to smoking again with ? aecopd x 4 days  Dyspnea:  Baseline mb and back then a lot worse x 4 d assoc with cough/ sneezing nasal congestion Cough: clear  Sleeping: bad attributes  to cough but not using cough meds at all, wants one called in  SABA use: 4-5 x daily plus nebulizer plus trelegy  02: 3lpm adusts  rec Prednisone 10 mg take  4 each am x 2 days,   2 each am x 2 days,  1 each am x 2 days and stop  The key is to stop smoking completely before smoking completely stops you! For cough > mucinex dm 1200 mg every 12 hours or delsym liquid 2 tsp every 12 hours are the strongest you can get without a precription Continue trelegy on click first thing each am  Only use your albuterol as a rescue medication to be used  Please schedule a follow up office visit in 4 weeks, sooner if needed  with all medications /inhalers/ solutions in hand so we can verify exactly what you are taking. This includes all medications from all doctors and over the counters   Virtual Visit via Telephone Note 02/02/2020 History of Present Illness:  2nd covid vaccine  In April 2021  Dyspnea: gets off at door, uses scooter to shop /washes dishes, eating better  Cough: 6/19  More cough / turning yellow and increased albuterol  Sleeping: on back 2 pillows SABA use: baseline twice daily  02: uses prn /  At rest  Mid  90s , never check it walking  rec Prednisone 10 mg take  4 each am x 2 days,   2 each am x 2 days,  1 each am x 2 days and stop  Zpak  The key is to stop smoking completely before smoking completely stops you! Make sure you check your oxygen saturations at highest level of activity For cough > mucinex dm 1200 mg every 12 hours or for dry cough  delsym liquid 2 tsp every 12 hours are the strongest you can get without a precription Continue trelegy on click first thing each am  Only use your albuterol as a rescue medication   Virtual Visit via Telephone Note 05/26/2020   I connected with Vanessa Haas on 05/26/20 at 4 PM  by telephone and verified that I am speaking with the correct person using two identifiers. Pt is at home and this call made from my office with no other participants    I discussed the limitations, risks, security and privacy concerns of performing an evaluation and management service by telephone and the availability of in person appointments. I also discussed with the patient that there may be a patient responsible charge related to this service. The patient expressed understanding and agreed to proceed.   History of Present Illness: copd ? Gold / no pfts/ still smoking Dyspnea:  Across the room  Cough: congested  Sleeping: bed is flat / two pillows but sometimes up in recline  SABA use: three times a day neb  02: 2lpm hs and prn / sats running  New problem with painful swelling under L arm x sev days seeing PCP 10/14 to sort out    No obvious day to day or daytime variability or assoc excess/ purulent sputum or mucus plugs or hemoptysis or cp or chest tightness, subjective wheeze or overt sinus or hb symptoms.    Also denies  any obvious fluctuation of symptoms with weather or environmental changes or other aggravating or alleviating factors except as outlined above.   Meds reviewed/ med reconciliation completed     No outpatient medications have been marked  as taking for the 05/26/20 encounter (Office Visit) with Nyoka Cowden, MD.         Observations/Objective: Quite verbose s conversational sob/ good voice texture    Assessment and Plan: See problem list for active a/p's   Follow Up Instructions: See avs for instructions unique to this ov which includes revised/ updated med list     I discussed the assessment and treatment plan with the patient. The patient was provided an opportunity to ask questions and all were answered. The patient agreed with the plan and demonstrated an understanding of the instructions.   The patient was advised to call back or seek an in-person evaluation if the symptoms worsen or if the condition fails to improve as anticipated.  I provided 17 minutes of non-face-to-face time during this encounter.   Sandrea Hughs, MD                              Past Medical History:  Diagnosis Date  . Anxiety   . Bipolar 1 disorder, depressed (HCC)   . CAD (coronary artery disease)   . Cataract   . COPD (chronic obstructive pulmonary disease) (HCC)   . Depression   . Hepatitis C    Type II  . Hyperlipidemia   . Hypertension   . Migraine   . Neuropathy   . Seizures (HCC)   . Substance abuse (HCC)    drug free x4 mos (cocaine)

## 2020-05-26 NOTE — Telephone Encounter (Signed)
Spoke with the pt  Her appt was for this am at 9:15 and the time is now 11:31 AM She reports sinus congestion, cough, sore throat- reschedule her appt for this afternoon to be a televisit  Nothing further needed

## 2020-05-26 NOTE — Assessment & Plan Note (Signed)
Using 2lpm hs and prn   Advised Make sure you check your oxygen saturations at highest level of activity to be sure it stays over 90% and adjust  02 flow upward to maintain this level if needed but remember to turn it back to previous settings when you stop (to conserve your supply).   RTC 4 weeks with all meds in hand using a trust but verify approach to confirm accurate Medication  Reconciliation The principal here is that until we are certain that the  patients are doing what we've asked, it makes no sense to ask them to do more.   Each maintenance medication was reviewed in detail including most importantly the difference between maintenance and as needed and under what circumstances the prns are to be used.  Please see AVS for specific  Instructions which are unique to this visit and I personally typed out  which were reviewed in detail over the phone with the patient and a copy provided via regular mail

## 2020-06-23 ENCOUNTER — Other Ambulatory Visit: Payer: Self-pay | Admitting: Internal Medicine

## 2020-06-30 ENCOUNTER — Other Ambulatory Visit: Payer: Self-pay

## 2020-06-30 ENCOUNTER — Emergency Department (HOSPITAL_COMMUNITY)
Admission: EM | Admit: 2020-06-30 | Discharge: 2020-06-30 | Disposition: A | Discharge status: Home / Self Care | Payer: Medicaid Other | Attending: Emergency Medicine | Admitting: Emergency Medicine

## 2020-06-30 DIAGNOSIS — Z79899 Other long term (current) drug therapy: Secondary | ICD-10-CM | POA: Insufficient documentation

## 2020-06-30 DIAGNOSIS — Z87891 Personal history of nicotine dependence: Secondary | ICD-10-CM | POA: Insufficient documentation

## 2020-06-30 DIAGNOSIS — J449 Chronic obstructive pulmonary disease, unspecified: Secondary | ICD-10-CM | POA: Diagnosis not present

## 2020-06-30 DIAGNOSIS — S0101XA Laceration without foreign body of scalp, initial encounter: Secondary | ICD-10-CM | POA: Diagnosis not present

## 2020-06-30 DIAGNOSIS — I10 Essential (primary) hypertension: Secondary | ICD-10-CM | POA: Diagnosis not present

## 2020-06-30 DIAGNOSIS — S0990XA Unspecified injury of head, initial encounter: Secondary | ICD-10-CM | POA: Diagnosis not present

## 2020-06-30 DIAGNOSIS — S8011XA Contusion of right lower leg, initial encounter: Secondary | ICD-10-CM | POA: Diagnosis not present

## 2020-06-30 DIAGNOSIS — I2510 Atherosclerotic heart disease of native coronary artery without angina pectoris: Secondary | ICD-10-CM | POA: Insufficient documentation

## 2020-06-30 DIAGNOSIS — Y92410 Unspecified street and highway as the place of occurrence of the external cause: Secondary | ICD-10-CM | POA: Diagnosis not present

## 2020-06-30 DIAGNOSIS — S80212A Abrasion, left knee, initial encounter: Secondary | ICD-10-CM | POA: Insufficient documentation

## 2020-06-30 NOTE — ED Notes (Signed)
Pt leaving AMA, Dr Bernette Mayers spoke with pt about the risks of leaving but pt still stating she is leaving.
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
## 2020-06-30 NOTE — ED Notes (Signed)
Pt refusing care, plans to leave AMA.

## 2020-07-22 ENCOUNTER — Ambulatory Visit: Payer: Medicaid Other | Admitting: Internal Medicine

## 2020-08-18 ENCOUNTER — Telehealth: Payer: Self-pay | Admitting: Internal Medicine

## 2020-08-18 NOTE — Telephone Encounter (Signed)
No answer LMOMTCB .

## 2020-08-19 NOTE — Telephone Encounter (Signed)
Lmtcb for pt.  

## 2020-08-24 NOTE — Telephone Encounter (Signed)
lmtcb for pt.  Due to several unsuccessful attempts to reach pt, message will be closed per triage protocol.

## 2020-09-07 ENCOUNTER — Telehealth: Payer: Self-pay | Admitting: Internal Medicine

## 2020-09-07 MED ORDER — ALBUTEROL SULFATE (2.5 MG/3ML) 0.083% IN NEBU
INHALATION_SOLUTION | RESPIRATORY_TRACT | 5 refills | Status: DC
Start: 2020-09-07 — End: 2021-03-08

## 2020-09-07 NOTE — Telephone Encounter (Signed)
Wants new refill sent to pharm. Uses Albuterol neb Three times a day  , not enough is being sent for each month.  Rx corrected and sent to pharmacy .

## 2020-09-15 ENCOUNTER — Telehealth: Payer: Self-pay | Admitting: Internal Medicine

## 2020-09-16 MED ORDER — TRELEGY ELLIPTA 100-62.5-25 MCG/INH IN AEPB: 1.0000 | INHALATION_SPRAY | Freq: Every day | RESPIRATORY_TRACT | 0 refills | Status: DC

## 2020-09-16 NOTE — Telephone Encounter (Signed)
Patient states has one puff left for today. Needs by noon tomorrow. States pharmacy delivers to her house. Patient phone number is [phone].

## 2020-09-16 NOTE — Telephone Encounter (Signed)
Called and spoke with patient.  She is going to stop by tomorrow for an inhaler until we hear back from the PA since patient will be out of inhaler 1 more puff.   Samples set up front for patient's family to pick up for her on Friday

## 2020-09-16 NOTE — Telephone Encounter (Signed)
Called and spoke to [employer] with pt's insurance [phone]) and initiated PA for Trelegy. PA is pending and a decision will be made within 24 hours and we will be notified via fax. PA#: [id number].   Will keep encounter open to follow up.

## 2020-10-14 ENCOUNTER — Other Ambulatory Visit: Payer: Self-pay | Admitting: Internal Medicine

## 2020-12-25 IMAGING — DX DG CHEST 2V
2 series · 2 of 2 positions shown · non-contrast
Comparison: None.

CLINICAL DATA: Shortness of breath, cough

EXAM:
CHEST - 2 VIEW

[chest pa]
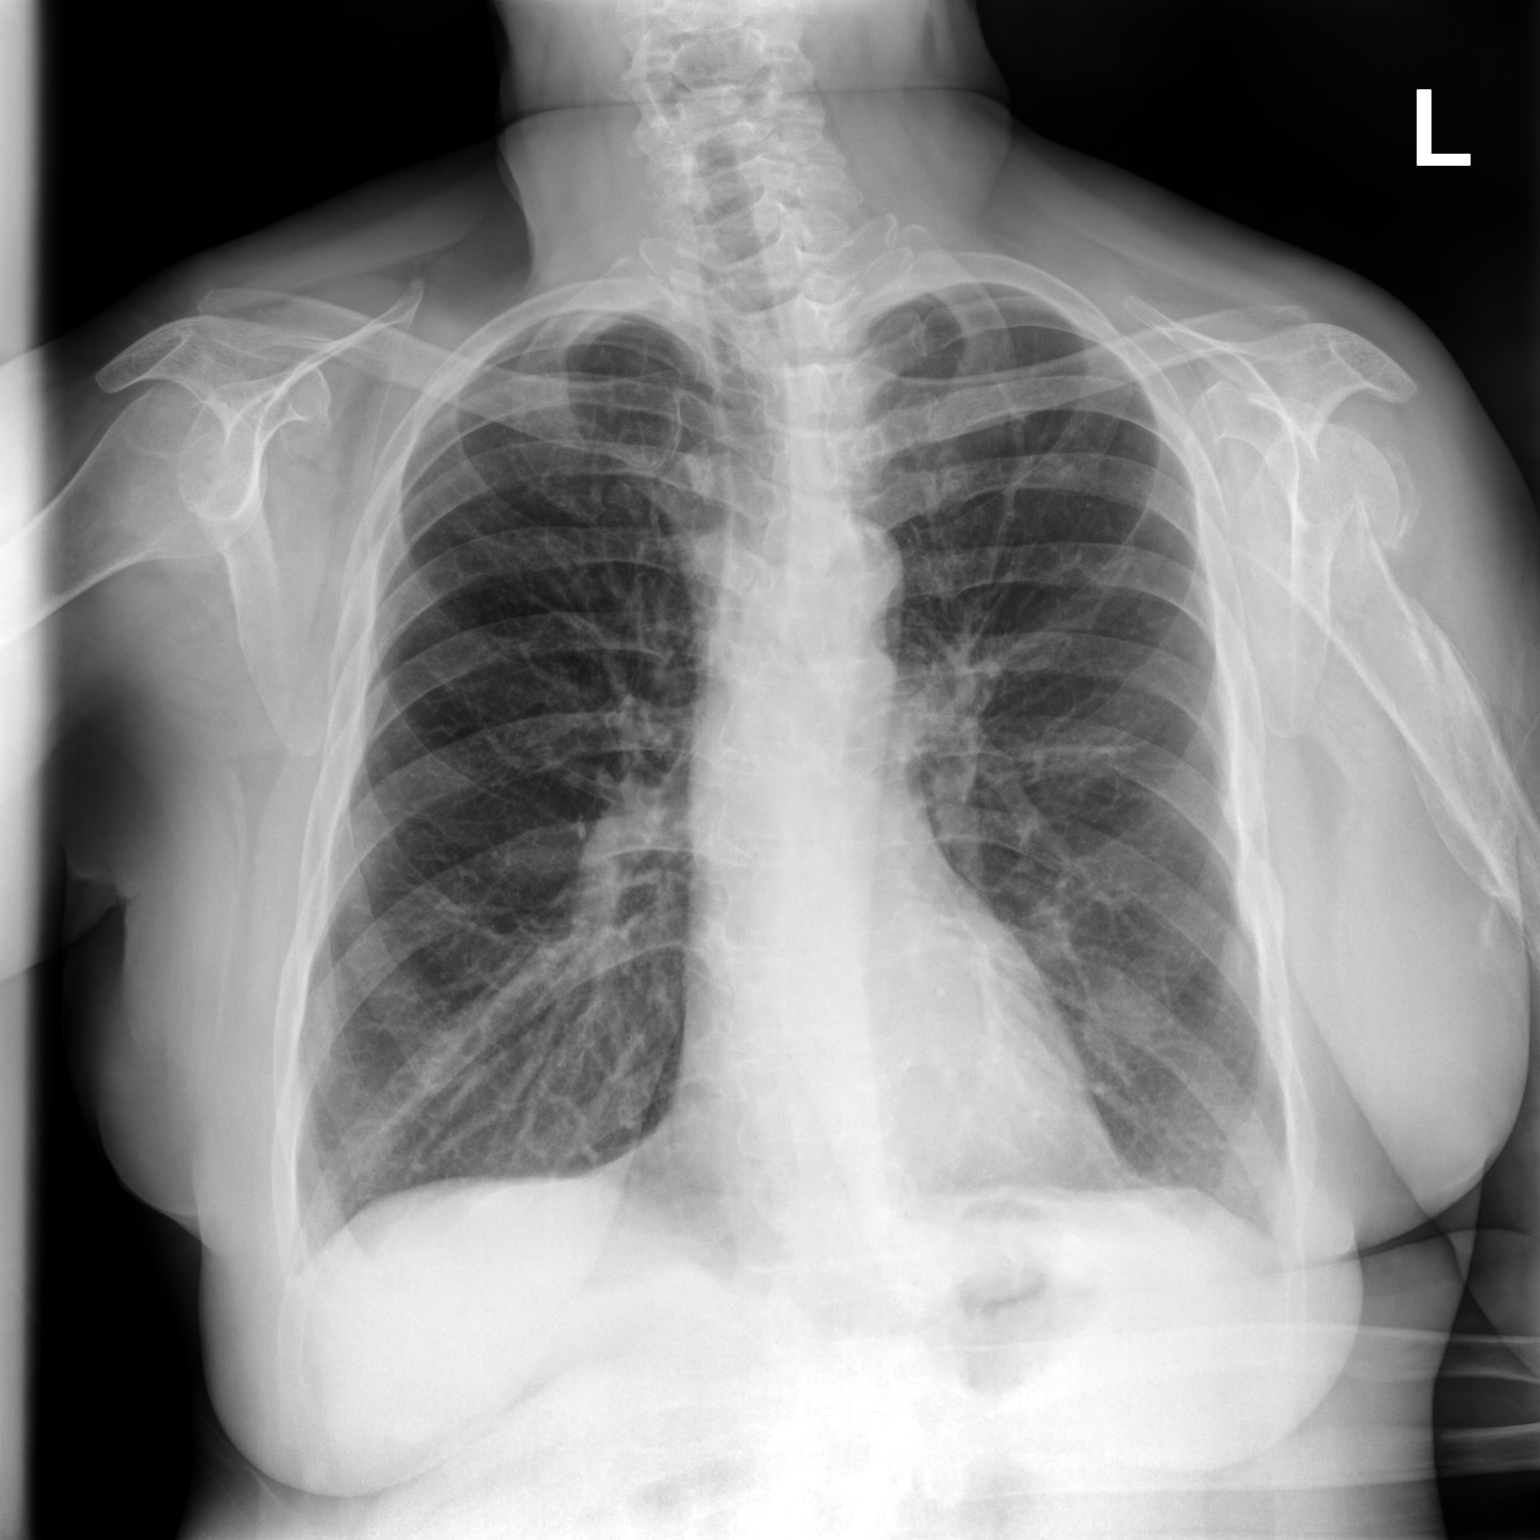

[chest lat]
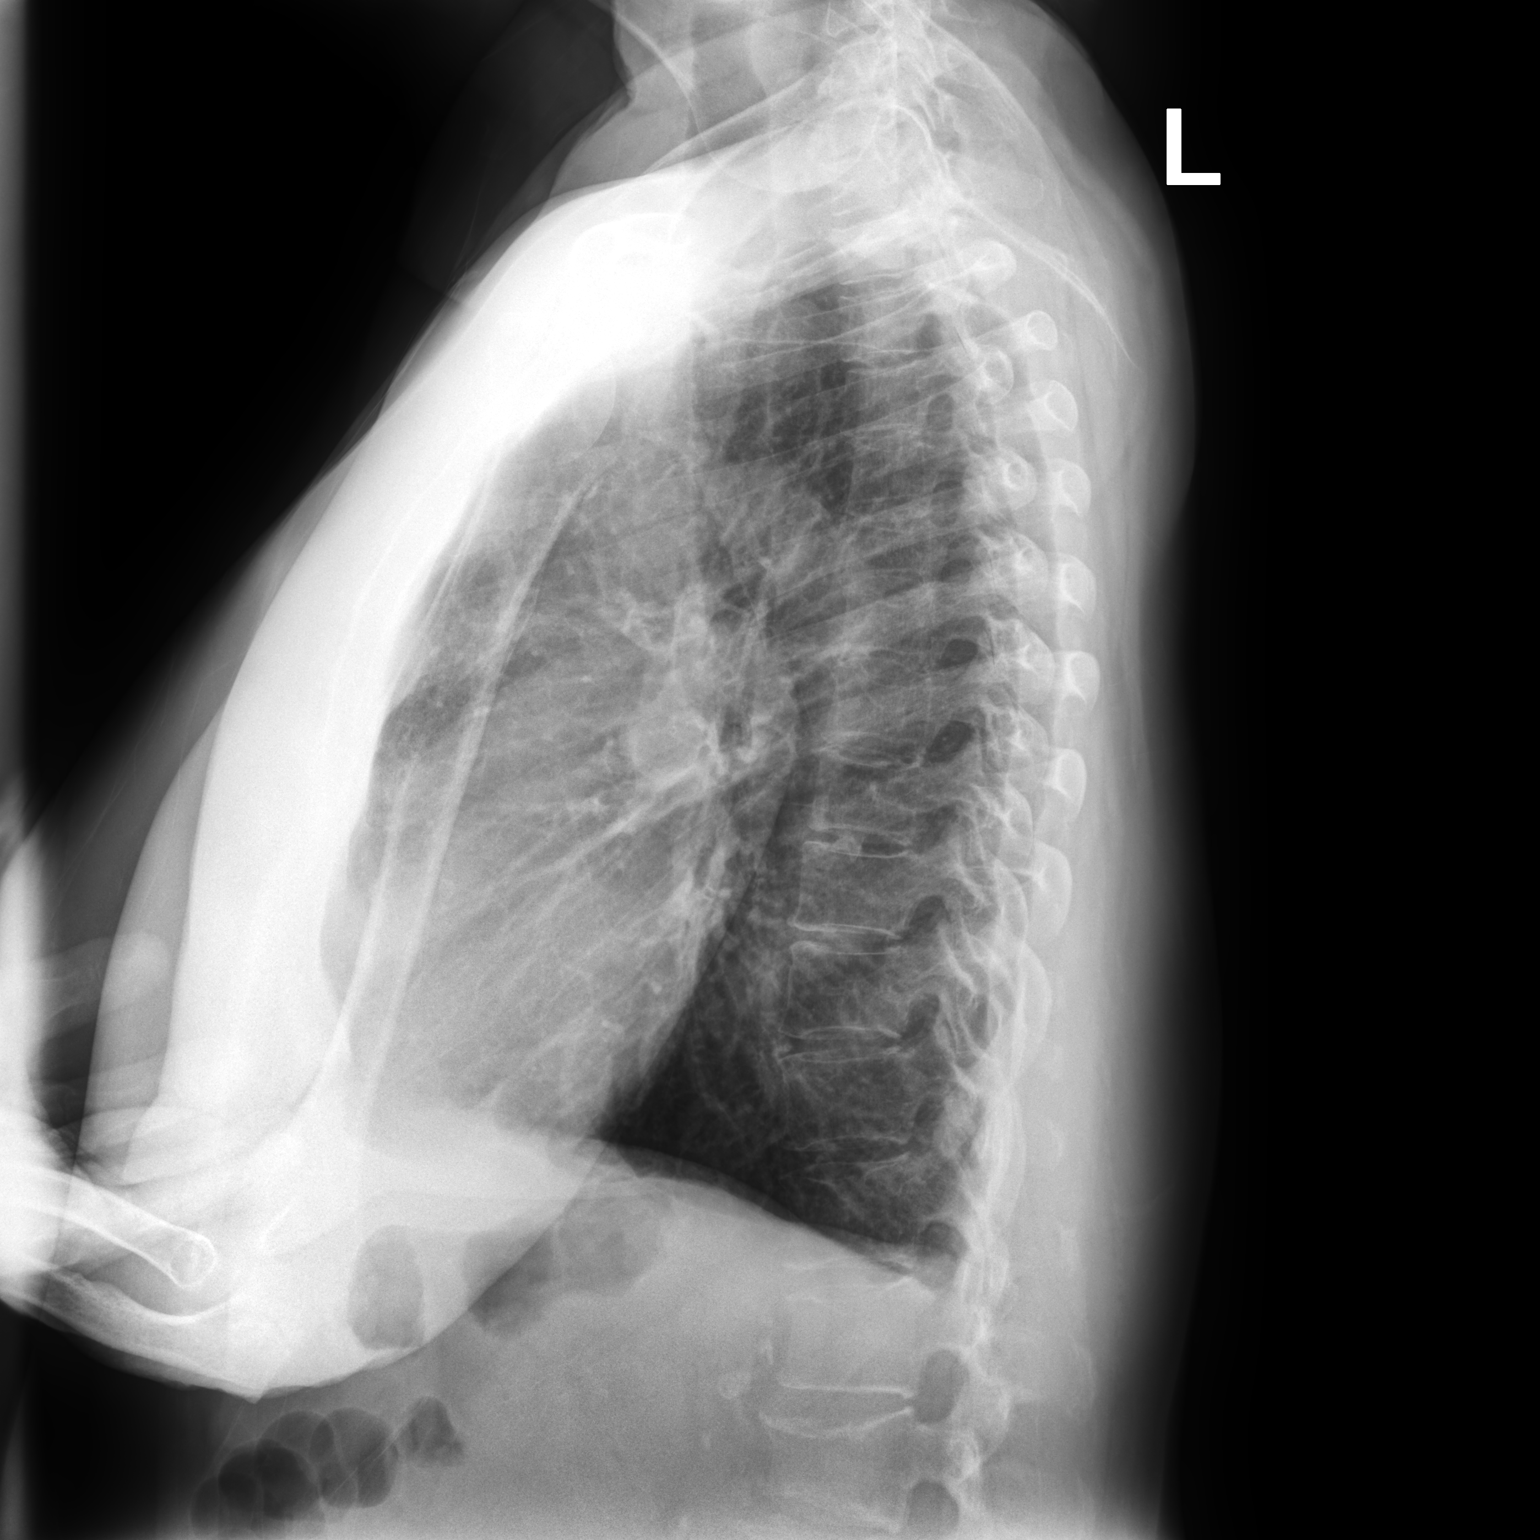

[2 of 2 positions shown; findings below may reference images not displayed]

FINDINGS: No new consolidation or edema. No pleural effusion or pneumothorax.
Normal heart size. Recent and chronic left humeral fractures and
chronic left rib fractures.
IMPRESSION: No acute process in the chest.

## 2021-01-05 ENCOUNTER — Other Ambulatory Visit: Payer: Self-pay | Admitting: Internal Medicine

## 2021-01-05 DIAGNOSIS — J449 Chronic obstructive pulmonary disease, unspecified: Secondary | ICD-10-CM

## 2021-01-12 ENCOUNTER — Telehealth: Payer: Self-pay | Admitting: Internal Medicine

## 2021-01-12 NOTE — Telephone Encounter (Signed)
PA request was received from (pharmacy): [facility] [phone] Fax: [phone] Medication name and strength: Trelegy Ellipta 100 Ordering Provider: Sherene Sires  Was PA started with [facility]?: yes If yes, please enter KEY: [id number] Medication tried and failed: Breztri, Symbicort, Sima Matas, Incruse   PA sent to plan, time frame for approval / denial:   [facility] has not yet replied to your PA request. You may close this dialog, return to your dashboard, and perform other tasks.  To check for an update later, open this request again from your dashboard.  If [facility] has not replied to your request within 24 hours, please reach out to the plan using the phone number located on the back on the [employer] card. Routing to Bartolo for follow-up

## 2021-01-20 ENCOUNTER — Other Ambulatory Visit (HOSPITAL_COMMUNITY): Payer: Self-pay

## 2021-01-20 NOTE — Telephone Encounter (Signed)
Dr. Sherene Sires, please advise on this. I asked how much the Trelegy would cost with the PA being denied and it would be over $700 for pt.

## 2021-01-20 NOTE — Telephone Encounter (Signed)
Checked CMM and the PA was denied:  Message from [employer]. The drug that you asked for is not covered. We are denying your request because we do not show that you have tried at least 2 preferred drugs. <>. Other covered drug(s) is/are: <>. Note: Some covered drug(s) may have limits on the quantity you can get. You can get this information on the list of covered drugs (Preferred Drug List) for details.  Pt has tried/failed Breztri, Symbicort, Breo, Spiriva, and Incruse.   Mills Koller, is there any way you can help look into this for [place] to try to figure out what inhalers pt might still need to try in order to get the PA approved as I am not sure why it was still denied with all inhalers pt has tried.

## 2021-01-20 NOTE — Telephone Encounter (Signed)
Apparently Advair Diskus 250-50 mcg is free through the patient's insurance with a DAW code 9 (plan requested brand name) applied to it. Could be worth a try, but honestly I would have the patient contact their insurance company and get ahold of the formulary list to see what else they need to try first. Otherwise you can try an appeal since the pt has certainly tried quite a few different options already.

## 2021-01-21 NOTE — Telephone Encounter (Signed)
Ok to try advair 250/50 or samples of treley 100 until can get in with drug formulary to regroup, ok to see np if needed

## 2021-01-21 NOTE — Telephone Encounter (Signed)
ATC x1, reached vm.  Left VM to return call.  According to Epic, she is currently hospitalized.

## 2021-01-26 MED ORDER — FLUTICASONE-SALMETEROL 250-50 MCG/ACT IN AEPB: 1.0000 | INHALATION_SPRAY | Freq: Two times a day (BID) | RESPIRATORY_TRACT | 3 refills | Status: AC

## 2021-01-26 NOTE — Telephone Encounter (Signed)
Called and spoke with pt about her recent hospital stay and have scheduled her a HFU appt with SG 6/22. Also spoke with pt about the prior auth that was done on her Trelegy inhaler and that it was denied.  Stated to her that after speaking with our pharmacy team at the office and also with MW that he said it was okay for her to try advair which should be free through her insurance. Pt also said that she might try to appeal the denial on the Trelegy with insurance. I have sent Rx for Advair to pharmacy for pt. Nothing further needed.

## 2021-02-02 ENCOUNTER — Inpatient Hospital Stay: Payer: Medicaid Other | Admitting: Acute Care

## 2021-02-23 ENCOUNTER — Inpatient Hospital Stay: Payer: Medicaid Other | Admitting: Acute Care

## 2021-03-08 ENCOUNTER — Other Ambulatory Visit: Payer: Self-pay | Admitting: Adult Health

## 2021-03-16 ENCOUNTER — Inpatient Hospital Stay: Payer: Medicaid Other | Admitting: Adult Health

## 2021-09-14 DEATH — deceased

## 2021-10-12 DEATH — deceased
